# Patient Record
Sex: Male | Born: 1970 | Race: Black or African American | Hispanic: No | Marital: Married | Smoking: Never smoker
Health system: Southern US, Community
[De-identification: ages and names within clinical notes are randomized; demographics above are authoritative.]

## PROBLEM LIST (undated history)

## (undated) DIAGNOSIS — K56609 Unspecified intestinal obstruction, unspecified as to partial versus complete obstruction: Secondary | ICD-10-CM

## (undated) DIAGNOSIS — I509 Heart failure, unspecified: Secondary | ICD-10-CM

## (undated) DIAGNOSIS — I10 Essential (primary) hypertension: Secondary | ICD-10-CM

## (undated) DIAGNOSIS — R0600 Dyspnea, unspecified: Secondary | ICD-10-CM

## (undated) DIAGNOSIS — M1990 Unspecified osteoarthritis, unspecified site: Secondary | ICD-10-CM

## (undated) DIAGNOSIS — E119 Type 2 diabetes mellitus without complications: Secondary | ICD-10-CM

## (undated) DIAGNOSIS — M109 Gout, unspecified: Secondary | ICD-10-CM

## (undated) DIAGNOSIS — R0609 Other forms of dyspnea: Secondary | ICD-10-CM

## (undated) DIAGNOSIS — G4730 Sleep apnea, unspecified: Secondary | ICD-10-CM

## (undated) DIAGNOSIS — R079 Chest pain, unspecified: Secondary | ICD-10-CM

## (undated) HISTORY — DX: Heart failure, unspecified: I50.9

## (undated) HISTORY — DX: Unspecified intestinal obstruction, unspecified as to partial versus complete obstruction: K56.609

## (undated) HISTORY — PX: TOTAL HIP ARTHROPLASTY: SHX124

## (undated) HISTORY — PX: CARDIAC CATHETERIZATION: SHX172

---
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
## 1997-10-11 ENCOUNTER — Emergency Department (HOSPITAL_COMMUNITY): Admission: EM | Admit: 1997-10-11 | Discharge: 1997-10-11 | Payer: Self-pay

## 1998-02-27 ENCOUNTER — Emergency Department (HOSPITAL_COMMUNITY): Admission: EM | Admit: 1998-02-27 | Discharge: 1998-02-27 | Payer: Self-pay | Admitting: Emergency Medicine

## 2000-10-20 ENCOUNTER — Emergency Department (HOSPITAL_COMMUNITY): Admission: EM | Admit: 2000-10-20 | Discharge: 2000-10-21 | Payer: Self-pay

## 2000-10-20 ENCOUNTER — Encounter: Payer: Self-pay | Admitting: Emergency Medicine

## 2003-07-13 ENCOUNTER — Encounter: Admission: RE | Admit: 2003-07-13 | Discharge: 2003-07-13 | Payer: Self-pay | Admitting: Cardiovascular Disease

## 2003-09-23 ENCOUNTER — Emergency Department (HOSPITAL_COMMUNITY): Admission: EM | Admit: 2003-09-23 | Discharge: 2003-09-24 | Payer: Self-pay | Admitting: Emergency Medicine

## 2004-10-05 ENCOUNTER — Emergency Department (HOSPITAL_COMMUNITY): Admission: EM | Admit: 2004-10-05 | Discharge: 2004-10-05 | Payer: Self-pay | Admitting: Emergency Medicine

## 2005-06-06 ENCOUNTER — Encounter: Admission: RE | Admit: 2005-06-06 | Discharge: 2005-06-06 | Payer: Self-pay | Admitting: Cardiovascular Disease

## 2005-10-15 ENCOUNTER — Encounter (HOSPITAL_COMMUNITY): Admission: RE | Admit: 2005-10-15 | Discharge: 2005-12-12 | Payer: Self-pay | Admitting: Cardiovascular Disease

## 2007-09-08 ENCOUNTER — Ambulatory Visit (HOSPITAL_COMMUNITY): Admission: RE | Admit: 2007-09-08 | Discharge: 2007-09-09 | Payer: Self-pay | Admitting: Cardiovascular Disease

## 2008-01-22 ENCOUNTER — Emergency Department (HOSPITAL_COMMUNITY): Admission: EM | Admit: 2008-01-22 | Discharge: 2008-01-22 | Payer: Self-pay | Admitting: Emergency Medicine

## 2009-04-20 ENCOUNTER — Encounter: Admission: RE | Admit: 2009-04-20 | Discharge: 2009-04-20 | Payer: Self-pay | Admitting: Cardiovascular Disease

## 2012-03-17 ENCOUNTER — Other Ambulatory Visit: Payer: Self-pay | Admitting: Orthopedic Surgery

## 2012-04-07 ENCOUNTER — Inpatient Hospital Stay (HOSPITAL_COMMUNITY): Admission: RE | Admit: 2012-04-07 | Payer: Self-pay | Source: Ambulatory Visit

## 2012-04-13 ENCOUNTER — Inpatient Hospital Stay: Admit: 2012-04-13 | Payer: Self-pay | Admitting: Orthopedic Surgery

## 2012-04-13 SURGERY — ARTHROPLASTY, HIP, TOTAL,POSTERIOR APPROACH
Anesthesia: Choice | Laterality: Right

## 2012-08-14 ENCOUNTER — Encounter (HOSPITAL_COMMUNITY): Payer: Self-pay | Admitting: General Practice

## 2012-08-14 ENCOUNTER — Observation Stay (HOSPITAL_COMMUNITY)
Admission: AD | Admit: 2012-08-14 | Discharge: 2012-08-17 | Disposition: A | Discharge status: Home / Self Care | Payer: BC Managed Care – PPO | Source: Ambulatory Visit | Attending: Cardiovascular Disease | Admitting: Cardiovascular Disease

## 2012-08-14 DIAGNOSIS — R9439 Abnormal result of other cardiovascular function study: Secondary | ICD-10-CM | POA: Insufficient documentation

## 2012-08-14 DIAGNOSIS — E119 Type 2 diabetes mellitus without complications: Secondary | ICD-10-CM | POA: Insufficient documentation

## 2012-08-14 DIAGNOSIS — R079 Chest pain, unspecified: Secondary | ICD-10-CM | POA: Insufficient documentation

## 2012-08-14 DIAGNOSIS — E7800 Pure hypercholesterolemia, unspecified: Secondary | ICD-10-CM | POA: Insufficient documentation

## 2012-08-14 DIAGNOSIS — I2510 Atherosclerotic heart disease of native coronary artery without angina pectoris: Principal | ICD-10-CM | POA: Insufficient documentation

## 2012-08-14 DIAGNOSIS — I10 Essential (primary) hypertension: Secondary | ICD-10-CM | POA: Insufficient documentation

## 2012-08-14 DIAGNOSIS — I2589 Other forms of chronic ischemic heart disease: Secondary | ICD-10-CM | POA: Insufficient documentation

## 2012-08-14 HISTORY — DX: Chest pain, unspecified: R07.9

## 2012-08-14 HISTORY — DX: Type 2 diabetes mellitus without complications: E11.9

## 2012-08-14 HISTORY — DX: Essential (primary) hypertension: I10

## 2012-08-14 LAB — CBC WITH DIFFERENTIAL/PLATELET
Basophils Absolute: 0 10*3/uL (ref 0.0–0.1)
Basophils Relative: 1 % (ref 0–1)
HCT: 43 % (ref 39.0–52.0)
Hemoglobin: 14.6 g/dL (ref 13.0–17.0)
Lymphocytes Relative: 46 % (ref 12–46)
MCHC: 34 g/dL (ref 30.0–36.0)
Monocytes Relative: 10 % (ref 3–12)
Neutro Abs: 2.1 10*3/uL (ref 1.7–7.7)
Neutrophils Relative %: 41 % — ABNORMAL LOW (ref 43–77)
RDW: 15 % (ref 11.5–15.5)
WBC: 5 10*3/uL (ref 4.0–10.5)

## 2012-08-14 LAB — COMPREHENSIVE METABOLIC PANEL
ALT: 33 U/L (ref 0–53)
AST: 25 U/L (ref 0–37)
Albumin: 3.9 g/dL (ref 3.5–5.2)
Alkaline Phosphatase: 104 U/L (ref 39–117)
CO2: 25 mEq/L (ref 19–32)
Chloride: 105 mEq/L (ref 96–112)
GFR calc non Af Amer: >90 mL/min (ref >90)
Potassium: 3.9 mEq/L (ref 3.5–5.1)
Total Bilirubin: 0.3 mg/dL (ref 0.3–1.2)

## 2012-08-14 LAB — HEMOGLOBIN A1C: Mean Plasma Glucose: 200 mg/dL — ABNORMAL HIGH (ref <117)

## 2012-08-14 LAB — PROTIME-INR: INR: 1.01 (ref 0.00–1.49)

## 2012-08-14 LAB — TROPONIN I: Troponin I: <0.3 ng/mL (ref <0.30)

## 2012-08-14 LAB — HEPARIN LEVEL (UNFRACTIONATED): Heparin Unfractionated: 0.43 IU/mL (ref 0.30–0.70)

## 2012-08-14 LAB — APTT: aPTT: 28 seconds (ref 24–37)

## 2012-08-14 MED ORDER — OXYCODONE-ACETAMINOPHEN 5-325 MG PO TABS
1.0000 | ORAL_TABLET | ORAL | Status: DC | PRN
  Administered 2012-08-14 – 2012-08-17 (×7): 1 via ORAL
  Filled 2012-08-14 (×7): qty 1

## 2012-08-14 MED ORDER — INSULIN GLARGINE 100 UNIT/ML ~~LOC~~ SOLN
10.0000 [IU] | Freq: Every day | SUBCUTANEOUS | Status: DC
  Filled 2012-08-14 (×4): qty 0.1

## 2012-08-14 MED ORDER — ALPRAZOLAM 0.25 MG PO TABS: 0.2500 mg | ORAL_TABLET | Freq: Two times a day (BID) | ORAL | Status: DC | PRN

## 2012-08-14 MED ORDER — METOPROLOL TARTRATE 12.5 MG HALF TABLET
12.5000 mg | ORAL_TABLET | Freq: Two times a day (BID) | ORAL | Status: DC
  Administered 2012-08-14 – 2012-08-17 (×7): 12.5 mg via ORAL
  Filled 2012-08-14 (×8): qty 1

## 2012-08-14 MED ORDER — HEPARIN BOLUS VIA INFUSION
4000.0000 [IU] | Freq: Once | INTRAVENOUS | Status: AC
  Administered 2012-08-14: 4000 [IU] via INTRAVENOUS
  Filled 2012-08-14: qty 4000

## 2012-08-14 MED ORDER — ONDANSETRON HCL 4 MG/2ML IJ SOLN: 4.0000 mg | Freq: Four times a day (QID) | INTRAMUSCULAR | Status: DC | PRN

## 2012-08-14 MED ORDER — ASPIRIN 81 MG PO CHEW
324.0000 mg | CHEWABLE_TABLET | ORAL | Status: AC
  Administered 2012-08-14: 324 mg via ORAL
  Filled 2012-08-14: qty 4

## 2012-08-14 MED ORDER — INSULIN ASPART 100 UNIT/ML ~~LOC~~ SOLN: 4.0000 [IU] | Freq: Three times a day (TID) | SUBCUTANEOUS | Status: DC

## 2012-08-14 MED ORDER — LISINOPRIL 5 MG PO TABS
5.0000 mg | ORAL_TABLET | Freq: Every day | ORAL | Status: DC
  Administered 2012-08-14 – 2012-08-17 (×4): 5 mg via ORAL
  Filled 2012-08-14 (×4): qty 1

## 2012-08-14 MED ORDER — HEPARIN (PORCINE) IN NACL 100-0.45 UNIT/ML-% IJ SOLN
1750.0000 [IU]/h | INTRAMUSCULAR | Status: DC
  Administered 2012-08-14 – 2012-08-17 (×4): 1750 [IU]/h via INTRAVENOUS
  Filled 2012-08-14 (×6): qty 250

## 2012-08-14 MED ORDER — SIMVASTATIN 20 MG PO TABS
20.0000 mg | ORAL_TABLET | Freq: Every day | ORAL | Status: DC
  Administered 2012-08-14 – 2012-08-17 (×4): 20 mg via ORAL
  Filled 2012-08-14 (×4): qty 1

## 2012-08-14 MED ORDER — ASPIRIN 300 MG RE SUPP
300.0000 mg | RECTAL | Status: AC
  Filled 2012-08-14: qty 1

## 2012-08-14 MED ORDER — NITROGLYCERIN 0.4 MG SL SUBL
0.4000 mg | SUBLINGUAL_TABLET | SUBLINGUAL | Status: DC | PRN
  Administered 2012-08-14 – 2012-08-16 (×2): 0.4 mg via SUBLINGUAL
  Filled 2012-08-14: qty 25

## 2012-08-14 MED ORDER — GLIMEPIRIDE 2 MG PO TABS
2.0000 mg | ORAL_TABLET | Freq: Every day | ORAL | Status: DC
  Administered 2012-08-15 – 2012-08-17 (×3): 2 mg via ORAL
  Filled 2012-08-14 (×4): qty 1

## 2012-08-14 MED ORDER — AMLODIPINE BESYLATE 5 MG PO TABS
5.0000 mg | ORAL_TABLET | Freq: Every day | ORAL | Status: DC
  Administered 2012-08-14 – 2012-08-17 (×4): 5 mg via ORAL
  Filled 2012-08-14 (×4): qty 1

## 2012-08-14 MED ORDER — SODIUM CHLORIDE 0.9 % IJ SOLN: 3.0000 mL | INTRAMUSCULAR | Status: DC | PRN

## 2012-08-14 MED ORDER — ACETAMINOPHEN 325 MG PO TABS: 650.0000 mg | ORAL_TABLET | ORAL | Status: DC | PRN

## 2012-08-14 MED ORDER — METFORMIN HCL 500 MG PO TABS
500.0000 mg | ORAL_TABLET | Freq: Two times a day (BID) | ORAL | Status: DC
  Administered 2012-08-15 – 2012-08-16 (×4): 500 mg via ORAL
  Filled 2012-08-14 (×8): qty 1

## 2012-08-14 MED ORDER — SODIUM CHLORIDE 0.9 % IJ SOLN
3.0000 mL | Freq: Two times a day (BID) | INTRAMUSCULAR | Status: DC
  Administered 2012-08-14 – 2012-08-16 (×3): 3 mL via INTRAVENOUS

## 2012-08-14 MED ORDER — INSULIN ASPART 100 UNIT/ML ~~LOC~~ SOLN: 0.0000 [IU] | Freq: Three times a day (TID) | SUBCUTANEOUS | Status: DC

## 2012-08-14 MED ORDER — SODIUM CHLORIDE 0.9 % IV SOLN: 250.0000 mL | INTRAVENOUS | Status: DC | PRN

## 2012-08-14 MED ORDER — LINAGLIPTIN 5 MG PO TABS
5.0000 mg | ORAL_TABLET | Freq: Every day | ORAL | Status: DC
  Administered 2012-08-15 – 2012-08-17 (×3): 5 mg via ORAL
  Filled 2012-08-14 (×4): qty 1

## 2012-08-14 MED ORDER — ASPIRIN EC 81 MG PO TBEC
81.0000 mg | DELAYED_RELEASE_TABLET | Freq: Every day | ORAL | Status: DC
  Administered 2012-08-15 – 2012-08-16 (×2): 81 mg via ORAL
  Filled 2012-08-14 (×3): qty 1

## 2012-08-14 NOTE — Progress Notes (Signed)
Utilization review completed.  

## 2012-08-14 NOTE — Progress Notes (Signed)
ANTICOAGULATION CONSULT NOTE - Initial Consult  Pharmacy Consult for heparin Indication: chest pain/ACS  Allergies not on file  Patient Measurements:   Heparin Dosing Weight: 117  Vital Signs:    Labs: No results found for this basename: HGB, HCT, PLT, APTT, LABPROT, INR, HEPARINUNFRC, CREATININE, CKTOTAL, CKMB, TROPONINI,  in the last 72 hours  CrCl is unknown because no creatinine reading has been taken and the patient has no height on file.   Medical History: No past medical history on file.  Medications:  No prescriptions prior to admission   Scheduled:  . aspirin  324 mg Oral NOW   Or  . aspirin  300 mg Rectal NOW  . [START ON 08/15/2012] aspirin EC  81 mg Oral Daily  . insulin aspart  0-15 Units Subcutaneous TID WC  . insulin aspart  4 Units Subcutaneous TID WC  . insulin glargine  10 Units Subcutaneous QHS  . metoprolol tartrate  12.5 mg Oral BID  . simvastatin  20 mg Oral q1800  . sodium chloride  3 mL Intravenous Q12H    Assessment: 42 yo morbidly obese pt who was admitted for CP. IV heparin has been ordered to r/o MI.  Goal of Therapy:  Heparin level 0.3-0.7 units/ml Monitor platelets by anticoagulation protocol: Yes   Plan:   Heparin bolus 4000 units x1 Heparin drip at  1750 units/hr F/u with 6hr heparin level Daily level and CBC U[facility] [place] 08/14/2012,3:37 PM

## 2012-08-14 NOTE — H&P (Signed)
Jerry Hart is an 42 y.o. male.   Chief Complaint: Chest pain. HPI: 42 years old male with poorly controlled diabetes type II has 4 day history of sharp left sided chest pain along with left shoulder and arm pain. Patient feels this is different than his muscle pain. No fever or cough.  Past medical history: + DM, II, + hypertension, - smoking, - alcohol intake, + obesity, - exercise, + elevated cholesterol.  No past surgical history on file.  No family history on file. Social History:  has no tobacco, alcohol, and drug history on file.  Allergies: None  No prescriptions prior to admission    No results found for this or any previous visit (from the past 48 hour(s)). No results found.  @ROS @ Constitutional: Negative for activity change and appetite change.  HENT: Negative for neck stiffness.  Eyes: Negative for pain.  Respiratory: Negative for chest tightness and shortness of breath.  Cardiovascular: Positive for chest pain. and leg swelling.  Gastrointestinal: Negative for abdominal pain, nausea, vomiting and diarrhea.  Musculoskeletal: Positive for back pain. + Hip pain Skin: Negative for rash.  Neurological: Negative for numbness and headaches.  Psychiatric/Behavioral: Negative for anxiety.  Physical Exam Height 6\' 3"  (1.905 m), weight 141.976 kg (313 lb). Constitutional: He appears well developed and overnourished.  HENT: Head: Normocephalic and atraumatic. Eyes: Manson Passey, EOM are normal. Pupils are equal, round, and reactive to light. Tongue is pink and midline.  Neck: No JVD, Normal range of motion. Neck supple.  Cardiovascular: Normal rate, regular rhythm and normal heart sounds. II/VI systolic murmur.  Pulmonary/Chest: Effort normal and breath sounds normal. No respiratory distress. He has no wheezes. He has no rales.  Abdominal: Soft. Bowel sounds are increased. He exhibits distension. No epigastric tenderness. There is no rebound and no guarding.  Musculoskeletal:  Trace edema, no cyanosis, + clubbing.  Neurological: He is alert and oriented to person, place, and time. No cranial nerve deficit. Moves all 4 extremities. Skin: Skin is warm and dry.    Assessment/Plan Chest pain Hypertension Obesity DM, II  Place in observation R/O MI Nuclear stress test in AM.  [facility] S 08/14/2012, 3:47 PM

## 2012-08-15 ENCOUNTER — Observation Stay (HOSPITAL_COMMUNITY): Payer: BC Managed Care – PPO

## 2012-08-15 LAB — CBC
Hemoglobin: 13.6 g/dL (ref 13.0–17.0)
MCH: 25.7 pg — ABNORMAL LOW (ref 26.0–34.0)
MCHC: 33.3 g/dL (ref 30.0–36.0)
MCV: 77.3 fL — ABNORMAL LOW (ref 78.0–100.0)
RBC: 5.29 MIL/uL (ref 4.22–5.81)

## 2012-08-15 LAB — LIPID PANEL
Cholesterol: 136 mg/dL (ref 0–200)
HDL: 27 mg/dL — ABNORMAL LOW (ref >39)
LDL Cholesterol: 86 mg/dL (ref 0–99)
Total CHOL/HDL Ratio: 5 RATIO
Triglycerides: 116 mg/dL (ref <150)
VLDL: 23 mg/dL (ref 0–40)

## 2012-08-15 LAB — GLUCOSE, CAPILLARY: Glucose-Capillary: 160 mg/dL — ABNORMAL HIGH (ref 70–99)

## 2012-08-15 LAB — PROTIME-INR: INR: 1 (ref 0.00–1.49)

## 2012-08-15 LAB — BASIC METABOLIC PANEL
CO2: 22 mEq/L (ref 19–32)
Calcium: 9 mg/dL (ref 8.4–10.5)
Creatinine, Ser: 0.6 mg/dL (ref 0.50–1.35)
GFR calc non Af Amer: >90 mL/min (ref >90)
Glucose, Bld: 159 mg/dL — ABNORMAL HIGH (ref 70–99)
Sodium: 138 mEq/L (ref 135–145)

## 2012-08-15 MED ORDER — REGADENOSON 0.4 MG/5ML IV SOLN
0.4000 mg | Freq: Once | INTRAVENOUS | Status: AC
  Administered 2012-08-15: 0.4 mg via INTRAVENOUS
  Filled 2012-08-15: qty 5

## 2012-08-15 MED ORDER — TECHNETIUM TC 99M SESTAMIBI GENERIC - CARDIOLITE
30.0000 | Freq: Once | INTRAVENOUS | Status: AC | PRN
  Administered 2012-08-15: 30 via INTRAVENOUS

## 2012-08-15 MED ORDER — TECHNETIUM TC 99M SESTAMIBI GENERIC - CARDIOLITE
10.0000 | Freq: Once | INTRAVENOUS | Status: AC | PRN
  Administered 2012-08-15: 10 via INTRAVENOUS

## 2012-08-15 NOTE — Progress Notes (Signed)
ANTICOAGULATION CONSULT NOTE - Initial Consult  Pharmacy Consult for heparin Indication: chest pain/ACS  No Known Allergies  Patient Measurements: Height: 6\' 3"  (190.5 cm) Weight: 307 lb 6.4 oz (139.436 kg) IBW/kg (Calculated) : 84.5 Heparin Dosing Weight: 117  Vital Signs: Temp: 97.5 F (36.4 C) (05/17 0500) BP: 141/87 mmHg (05/17 0500) Pulse Rate: 72 (05/17 0500)  Labs:  Recent Labs  08/14/12 1548 08/14/12 2236 08/15/12 0517  HGB 14.6  --  13.6  HCT 43.0  --  40.9  PLT 225  --  212  APTT 28  --   --   LABPROT 13.2  --  13.1  INR 1.01  --  1.00  HEPARINUNFRC  --  0.43 0.39  CREATININE 0.68  --  0.60  TROPONINI <0.30 <0.30 <0.30    Estimated Creatinine Clearance: 184.9 ml/min (by C-G formula based on Cr of 0.6).   Medical History: Past Medical History  Diagnosis Date  . Chest pain 08/14/2012  . Hypertension   . Diabetes mellitus without complication     TYPE 2    Medications:  Prescriptions prior to admission  Medication Sig Dispense Refill  . amLODipine-olmesartan (AZOR) 5-40 MG per tablet Take 1 tablet by mouth daily.      Marland Kitchen glimepiride (AMARYL) 2 MG tablet Take 2 mg by mouth daily before breakfast.      . HYDROcodone-acetaminophen (NORCO/VICODIN) 5-325 MG per tablet Take 1 tablet by mouth daily.      Marland Kitchen linagliptin (TRADJENTA) 5 MG TABS tablet Take 5 mg by mouth daily.      . meloxicam (MOBIC) 15 MG tablet Take 15 mg by mouth daily.      . metFORMIN (GLUCOPHAGE) 500 MG tablet Take 500 mg by mouth daily with breakfast. Directions state 2 times per day       Scheduled:  . amLODipine  5 mg Oral Daily  . aspirin EC  81 mg Oral Daily  . glimepiride  2 mg Oral Q breakfast  . insulin aspart  0-15 Units Subcutaneous TID WC  . insulin aspart  4 Units Subcutaneous TID WC  . insulin glargine  10 Units Subcutaneous QHS  . linagliptin  5 mg Oral Daily  . lisinopril  5 mg Oral Daily  . metFORMIN  500 mg Oral BID WC  . metoprolol tartrate  12.5 mg Oral BID  .  simvastatin  20 mg Oral q1800  . sodium chloride  3 mL Intravenous Q12H    Assessment: 42 yo morbidly obese pt who was admitted for CP. IV heparin has been ordered to r/o MI. Level has been therapeutic x 2.  Goal of Therapy:  Heparin level 0.3-0.7 units/ml Monitor platelets by anticoagulation protocol: Yes   Plan:   Cont heparin drip 1750 units/hr

## 2012-08-15 NOTE — Progress Notes (Signed)
Pt c/o 5/10 CP, 1 SL nitro given, O2 initiated at 2L [place], EKG negative for changes, CP relieved, will continue to monitor.

## 2012-08-15 NOTE — Progress Notes (Signed)
ANTICOAGULATION CONSULT NOTE  Pharmacy Consult for heparin Indication: chest pain/ACS  No Known Allergies  Patient Measurements: Height: 6\' 3"  (190.5 cm) Weight: 313 lb (141.976 kg) IBW/kg (Calculated) : 84.5 Heparin Dosing Weight: 117  Vital Signs: Temp: 98 F (36.7 C) (05/16 2100) Temp src: Oral (05/16 1600) BP: 134/86 mmHg (05/16 2100) Pulse Rate: 74 (05/16 2100)  Labs:  Recent Labs  08/14/12 1548 08/14/12 2236  HGB 14.6  --   HCT 43.0  --   PLT 225  --   APTT 28  --   LABPROT 13.2  --   INR 1.01  --   HEPARINUNFRC  --  0.43  CREATININE 0.68  --   TROPONINI <0.30 <0.30    Estimated Creatinine Clearance: 186.6 ml/min (by C-G formula based on Cr of 0.68).   Assessment: 42 yo male with chest pain for heparin  Goal of Therapy:  Heparin level 0.3-0.7 units/ml Monitor platelets by anticoagulation protocol: Yes   Plan:  Continue Heparin at current rate  Follow-up am labs.  Osamah Schmader, Gary Fleet 08/15/2012,12:31 AM

## 2012-08-15 NOTE — Progress Notes (Signed)
Subjective:  Patient denies any further episodes of chest pain or shortness of breath  Objective:  Vital Signs in the last 24 hours: Temp:  [97.5 F (36.4 C)-98.2 F (36.8 C)] 97.5 F (36.4 C) (05/17 0500) Pulse Rate:  [70-87] 81 (05/17 0952) Resp:  [17-18] 18 (05/17 0500) BP: (114-141)/(74-94) 124/91 mmHg (05/17 0952) SpO2:  [97 %-98 %] 97 % (05/17 0500) Weight:  [139.436 kg (307 lb 6.4 oz)-141.976 kg (313 lb)] 139.436 kg (307 lb 6.4 oz) (05/17 0500)  Intake/Output from previous day:   Intake/Output from this shift:    Physical Exam: Neck: no adenopathy, no carotid bruit, no JVD and supple, symmetrical, trachea midline Lungs: clear to auscultation bilaterally Heart: regular rate and rhythm, S1, S2 normal, no murmur, click, rub or gallop Abdomen: soft, non-tender; bowel sounds normal; no masses,  no organomegaly Extremities: extremities normal, atraumatic, no cyanosis or edema  Lab Results:  Recent Labs  08/14/12 1548 08/15/12 0517  WBC 5.0 4.8  HGB 14.6 13.6  PLT 225 212    Recent Labs  08/14/12 1548 08/15/12 0517  NA 139 138  K 3.9 3.8  CL 105 105  CO2 25 22  GLUCOSE 128* 159*  BUN 12 12  CREATININE 0.68 0.60    Recent Labs  08/14/12 2236 08/15/12 0517  TROPONINI <0.30 <0.30   Hepatic Function Panel  Recent Labs  08/14/12 1548  PROT 7.7  ALBUMIN 3.9  AST 25  ALT 33  ALKPHOS 104  BILITOT 0.3    Recent Labs  08/15/12 0517  CHOL 136   No results found for this basename: PROTIME,  in the last 72 hours  Imaging: Imaging results have been reviewed and Dg Chest 2 View  08/15/2012   *RADIOLOGY REPORT*  Clinical Data: Chest pain  CHEST - 2 VIEW  Comparison: 04/20/2009  Findings: Mild right hemidiaphragm elevation. Midline trachea. Borderline cardiomegaly.     Mediastinal contours otherwise within normal limits.  No pleural effusion or pneumothorax.  Clear lungs.  IMPRESSION: Borderline cardiomegaly, without acute disease.   Original Report  Authenticated By: Jeronimo Greaves, M.D.    Cardiac Studies:  Assessment/Plan:  Status post chest pain MI ruled out Hypertension Diabetes matters Morbid obesity Hypercholesteremia Plan Schedule for nuclear stress test today if negative will DC home  LOS: 1 day    Jerry Hart N 08/15/2012, 10:12 AM

## 2012-08-16 LAB — GLUCOSE, CAPILLARY
Glucose-Capillary: 182 mg/dL — ABNORMAL HIGH (ref 70–99)
Glucose-Capillary: 104 mg/dL — ABNORMAL HIGH (ref 70–99)
Glucose-Capillary: 127 mg/dL — ABNORMAL HIGH (ref 70–99)

## 2012-08-16 LAB — CBC
HCT: 42.1 % (ref 39.0–52.0)
Hemoglobin: 14 g/dL (ref 13.0–17.0)
MCH: 25.8 pg — ABNORMAL LOW (ref 26.0–34.0)
MCHC: 33.3 g/dL (ref 30.0–36.0)
RDW: 15.2 % (ref 11.5–15.5)

## 2012-08-16 MED ORDER — DIAZEPAM 5 MG PO TABS: 5.0000 mg | ORAL_TABLET | ORAL | Status: AC

## 2012-08-16 MED ORDER — SODIUM CHLORIDE 0.9 % IJ SOLN: 3.0000 mL | INTRAMUSCULAR | Status: DC | PRN

## 2012-08-16 MED ORDER — SODIUM CHLORIDE 0.9 % IV SOLN
INTRAVENOUS | Status: DC
  Administered 2012-08-16 – 2012-08-17 (×2): via INTRAVENOUS

## 2012-08-16 MED ORDER — SODIUM CHLORIDE 0.9 % IV SOLN: 1.0000 mL/kg/h | INTRAVENOUS | Status: DC

## 2012-08-16 MED ORDER — SODIUM CHLORIDE 0.9 % IJ SOLN: 3.0000 mL | Freq: Two times a day (BID) | INTRAMUSCULAR | Status: DC

## 2012-08-16 MED ORDER — CLOPIDOGREL BISULFATE 75 MG PO TABS
300.0000 mg | ORAL_TABLET | Freq: Once | ORAL | Status: AC
  Administered 2012-08-17: 300 mg via ORAL
  Filled 2012-08-16: qty 4

## 2012-08-16 MED ORDER — DIAZEPAM 5 MG PO TABS
5.0000 mg | ORAL_TABLET | ORAL | Status: AC
  Administered 2012-08-17: 5 mg via ORAL
  Filled 2012-08-16: qty 1

## 2012-08-16 MED ORDER — SODIUM CHLORIDE 0.9 % IV SOLN: 250.0000 mL | INTRAVENOUS | Status: DC | PRN

## 2012-08-16 MED ORDER — ASPIRIN 81 MG PO CHEW
324.0000 mg | CHEWABLE_TABLET | ORAL | Status: AC
  Administered 2012-08-17: 324 mg via ORAL
  Filled 2012-08-16: qty 4

## 2012-08-16 NOTE — Progress Notes (Signed)
Subjective:  Patient denies any chest pain or shortness of breath. Discussed with patient regarding abnormal stress test and cardiac cath his risk and benefits and consents for the procedure  Objective:  Vital Signs in the last 24 hours: Temp:  [98 F (36.7 C)-98.2 F (36.8 C)] 98.2 F (36.8 C) (05/18 0639) Pulse Rate:  [71-80] 80 (05/18 0639) Resp:  [18] 18 (05/18 0639) BP: (119-126)/(75-86) 126/79 mmHg (05/18 0639) SpO2:  [98 %-99 %] 99 % (05/18 0639) Weight:  [140.116 kg (308 lb 14.4 oz)] 140.116 kg (308 lb 14.4 oz) (05/18 0639)  Intake/Output from previous day:   Intake/Output from this shift:    Physical Exam: Neck: no adenopathy, no carotid bruit, no JVD and supple, symmetrical, trachea midline Lungs: clear to auscultation bilaterally Heart: regular rate and rhythm, S1, S2 normal, no murmur, click, rub or gallop Abdomen: soft, non-tender; bowel sounds normal; no masses,  no organomegaly Extremities: extremities normal, atraumatic, no cyanosis or edema  Lab Results:  Recent Labs  08/15/12 0517 08/16/12 0435  WBC 4.8 6.2  HGB 13.6 14.0  PLT 212 237    Recent Labs  08/14/12 1548 08/15/12 0517  NA 139 138  K 3.9 3.8  CL 105 105  CO2 25 22  GLUCOSE 128* 159*  BUN 12 12  CREATININE 0.68 0.60    Recent Labs  08/14/12 2236 08/15/12 0517  TROPONINI <0.30 <0.30   Hepatic Function Panel  Recent Labs  08/14/12 1548  PROT 7.7  ALBUMIN 3.9  AST 25  ALT 33  ALKPHOS 104  BILITOT 0.3    Recent Labs  08/15/12 0517  CHOL 136   No results found for this basename: PROTIME,  in the last 72 hours  Imaging: Imaging results have been reviewed and Dg Chest 2 View  08/15/2012   *RADIOLOGY REPORT*  Clinical Data: Chest pain  CHEST - 2 VIEW  Comparison: 04/20/2009  Findings: Mild right hemidiaphragm elevation. Midline trachea. Borderline cardiomegaly.     Mediastinal contours otherwise within normal limits.  No pleural effusion or pneumothorax.  Clear lungs.   IMPRESSION: Borderline cardiomegaly, without acute disease.   Original Report Authenticated By: Jeronimo Greaves, M.D.   Nm Myocar Multi W/spect W/wall Motion / Ef  08/15/2012   *RADIOLOGY REPORT*  Clinical Data:  Chest pain.  Diabetes and hypertension.  MYOCARDIAL IMAGING WITH SPECT (REST AND PHARMACOLOGIC-STRESS) GATED LEFT VENTRICULAR WALL MOTION STUDY LEFT VENTRICULAR EJECTION FRACTION  Standard myocardial SPECT imaging was performed after resting intravenous injection of 10. Tc-36m tetrofosmin.  Subsequently, intravenous infusion of Lexiscan  was performed under the supervision of cardiology staff.  At peak effect of the drug, of Tc-21m tetrofosmin was injected intravenously and standard myocardial SPECT imaging was performed.  Quantitative gated imaging was also performed to evaluate left ventricular wall motion and estimated left ventricular ejection fraction.  Comparison:  Plain film 08/15/2012.  Prior exam of 09/08/2007.  Findings:  Rest images demonstrate mild left ventricular enlargement.  Apical to mid anteroseptal rest defect.  Stress images demonstrate area of inducible defect involving the apical to mid portion of the anteroseptal wall.  Evaluation of wall motion demonstrates global hypokinesis.  Ejection fraction is estimated at 43%.  End diastolic volume of 188 cc.  End systolic volume of  106 cc.  IMPRESSION:  1. Reversibility involving the apical to mid segment of the anteroseptal wall.  This is suspicious for inducible ischemia. This is superimposed upon a fixed defect, likely representing scar. 2.  Global hypokinesis. 3.  Ejection fraction estimated at 43%.  These results will be called to the ordering clinician or representative by the Radiologist Assistant, and communication documented in the PACS Dashboard.   Original Report Authenticated By: Jeronimo Greaves, M.D.    Cardiac Studies:  Assessment/Plan:  Status post chest pain MI ruled out /abnormal nuclear stress test as  above Hypertension  Diabetes matters  Morbid obesity  Hypercholesteremia Plan Discussed with patient at length regarding abnormal nuclear stress test and various options of treatment i.e. medical versus invasive left cath possible PTCA stenting its risk and benefits i.e. death MI stroke need for emergency CABG risk of restenosis local vascular complications etc. and consents for PCI  LOS: 2 days    Naima Veldhuizen N 08/16/2012, 12:23 PM

## 2012-08-16 NOTE — Progress Notes (Signed)
4/10 CP this morning at 0810, 1 SL nitro, 1 percocet, given, with relief, O2 @2L  [place], pain not associated with activity, pt was laying in bed, EKG WNL, will continue to monitor.

## 2012-08-16 NOTE — Progress Notes (Signed)
ANTICOAGULATION CONSULT NOTE - Initial Consult  Pharmacy Consult for heparin Indication: chest pain/ACS  No Known Allergies  Patient Measurements: Height: 6\' 3"  (190.5 cm) Weight: 308 lb 14.4 oz (140.116 kg) IBW/kg (Calculated) : 84.5 Heparin Dosing Weight: 117  Vital Signs: Temp: 98.2 F (36.8 C) (05/18 0639) Temp src: Oral (05/18 0639) BP: 126/79 mmHg (05/18 0639) Pulse Rate: 80 (05/18 0639)  Labs:  Recent Labs  08/14/12 1548  08/14/12 2236 08/15/12 0517 08/16/12 0435 08/16/12 0740  HGB 14.6  --   --  13.6 14.0  --   HCT 43.0  --   --  40.9 42.1  --   PLT 225  --   --  212 237  --   APTT 28  --   --   --   --   --   LABPROT 13.2  --   --  13.1  --   --   INR 1.01  --   --  1.00  --   --   HEPARINUNFRC  --   < > 0.43 0.39 >2.00* 0.49  CREATININE 0.68  --   --  0.60  --   --   TROPONINI <0.30  --  <0.30 <0.30  --   --   < > = values in this interval not displayed.  Estimated Creatinine Clearance: 185.2 ml/min (by C-G formula based on Cr of 0.6).   Medical History: Past Medical History  Diagnosis Date  . Chest pain 08/14/2012  . Hypertension   . Diabetes mellitus without complication     TYPE 2    Medications:  Prescriptions prior to admission  Medication Sig Dispense Refill  . amLODipine-olmesartan (AZOR) 5-40 MG per tablet Take 1 tablet by mouth daily.      Marland Kitchen glimepiride (AMARYL) 2 MG tablet Take 2 mg by mouth daily before breakfast.      . HYDROcodone-acetaminophen (NORCO/VICODIN) 5-325 MG per tablet Take 1 tablet by mouth daily.      Marland Kitchen linagliptin (TRADJENTA) 5 MG TABS tablet Take 5 mg by mouth daily.      . meloxicam (MOBIC) 15 MG tablet Take 15 mg by mouth daily.      . metFORMIN (GLUCOPHAGE) 500 MG tablet Take 500 mg by mouth daily with breakfast. Directions state 2 times per day       Scheduled:  . amLODipine  5 mg Oral Daily  . aspirin EC  81 mg Oral Daily  . glimepiride  2 mg Oral Q breakfast  . insulin aspart  0-15 Units Subcutaneous TID WC   . insulin aspart  4 Units Subcutaneous TID WC  . insulin glargine  10 Units Subcutaneous QHS  . linagliptin  5 mg Oral Daily  . lisinopril  5 mg Oral Daily  . metFORMIN  500 mg Oral BID WC  . metoprolol tartrate  12.5 mg Oral BID  . simvastatin  20 mg Oral q1800  . sodium chloride  3 mL Intravenous Q12H    Assessment: 42 yo morbidly obese pt who was admitted for CP. IV heparin has been ordered to r/o MI. First level was high this AM but was drawn incorrectly. Repeat level within therapeutic range.   Goal of Therapy:  Heparin level 0.3-0.7 units/ml Monitor platelets by anticoagulation protocol: Yes   Plan:   Cont heparin drip 1750 units/hr

## 2012-08-17 ENCOUNTER — Encounter (HOSPITAL_COMMUNITY)
Admission: AD | Disposition: A | Discharge status: Home / Self Care | Payer: Self-pay | Source: Ambulatory Visit | Attending: Cardiovascular Disease

## 2012-08-17 HISTORY — PX: LEFT HEART CATHETERIZATION WITH CORONARY ANGIOGRAM: SHX5451

## 2012-08-17 LAB — BASIC METABOLIC PANEL
BUN: 9 mg/dL (ref 6–23)
Chloride: 101 mEq/L (ref 96–112)
GFR calc Af Amer: >90 mL/min (ref >90)
GFR calc non Af Amer: >90 mL/min (ref >90)
Potassium: 3.8 mEq/L (ref 3.5–5.1)
Sodium: 135 mEq/L (ref 135–145)

## 2012-08-17 LAB — CBC
Hemoglobin: 13.9 g/dL (ref 13.0–17.0)
MCH: 25 pg — ABNORMAL LOW (ref 26.0–34.0)
Platelets: 213 10*3/uL (ref 150–400)
RBC: 5.55 MIL/uL (ref 4.22–5.81)
WBC: 5.7 10*3/uL (ref 4.0–10.5)

## 2012-08-17 LAB — HEPARIN LEVEL (UNFRACTIONATED): Heparin Unfractionated: 0.5 IU/mL (ref 0.30–0.70)

## 2012-08-17 LAB — GLUCOSE, CAPILLARY
Glucose-Capillary: 167 mg/dL — ABNORMAL HIGH (ref 70–99)
Glucose-Capillary: 191 mg/dL — ABNORMAL HIGH (ref 70–99)

## 2012-08-17 LAB — PROTIME-INR: Prothrombin Time: 13.1 seconds (ref 11.6–15.2)

## 2012-08-17 LAB — POCT ACTIVATED CLOTTING TIME: Activated Clotting Time: 127 seconds

## 2012-08-17 SURGERY — LEFT HEART CATHETERIZATION WITH CORONARY ANGIOGRAM
Anesthesia: LOCAL

## 2012-08-17 MED ORDER — LISINOPRIL 5 MG PO TABS: 5.0000 mg | ORAL_TABLET | Freq: Every day | ORAL | Status: DC

## 2012-08-17 MED ORDER — FENTANYL CITRATE 0.05 MG/ML IJ SOLN
INTRAMUSCULAR | Status: AC
  Filled 2012-08-17: qty 2

## 2012-08-17 MED ORDER — ACETAMINOPHEN 325 MG PO TABS: 650.0000 mg | ORAL_TABLET | ORAL | Status: DC | PRN

## 2012-08-17 MED ORDER — INSULIN GLARGINE 100 UNIT/ML ~~LOC~~ SOLN: 10.0000 [IU] | Freq: Every day | SUBCUTANEOUS | Status: DC

## 2012-08-17 MED ORDER — AMLODIPINE BESYLATE 5 MG PO TABS: 5.0000 mg | ORAL_TABLET | Freq: Every day | ORAL | Status: DC

## 2012-08-17 MED ORDER — HEPARIN (PORCINE) IN NACL 2-0.9 UNIT/ML-% IJ SOLN
INTRAMUSCULAR | Status: AC
  Filled 2012-08-17: qty 1000

## 2012-08-17 MED ORDER — MIDAZOLAM HCL 2 MG/2ML IJ SOLN
INTRAMUSCULAR | Status: AC
  Filled 2012-08-17: qty 2

## 2012-08-17 MED ORDER — LIDOCAINE HCL (PF) 1 % IJ SOLN
INTRAMUSCULAR | Status: AC
  Filled 2012-08-17: qty 30

## 2012-08-17 MED ORDER — "BD GETTING STARTED TAKE HOME KIT: 1ML X 30 G SYRINGES, "
1.0000 | Freq: Once | Status: AC
  Administered 2012-08-17: 1
  Filled 2012-08-17: qty 1

## 2012-08-17 MED ORDER — ASPIRIN 81 MG PO TBEC: 81.0000 mg | DELAYED_RELEASE_TABLET | Freq: Every day | ORAL | Status: DC

## 2012-08-17 MED ORDER — ROSUVASTATIN CALCIUM 5 MG PO TABS: 5.0000 mg | ORAL_TABLET | Freq: Every day | ORAL | Status: DC

## 2012-08-17 MED ORDER — SODIUM CHLORIDE 0.9 % IV SOLN
1.0000 mL/kg/h | INTRAVENOUS | Status: AC
  Administered 2012-08-17: 1 mL/kg/h via INTRAVENOUS

## 2012-08-17 MED ORDER — METOPROLOL TARTRATE 12.5 MG HALF TABLET: 12.5000 mg | ORAL_TABLET | Freq: Two times a day (BID) | ORAL | Status: DC

## 2012-08-17 MED ORDER — METFORMIN HCL 500 MG PO TABS: 500.0000 mg | ORAL_TABLET | Freq: Two times a day (BID) | ORAL | Status: DC

## 2012-08-17 MED ORDER — INSULIN PEN STARTER KIT
1.0000 | Freq: Once | Status: DC
  Filled 2012-08-17: qty 1

## 2012-08-17 MED ORDER — NITROGLYCERIN 0.4 MG SL SUBL: 0.4000 mg | SUBLINGUAL_TABLET | SUBLINGUAL | Status: DC | PRN

## 2012-08-17 NOTE — CV Procedure (Signed)
PROCEDURE:  Left heart catheterization with selective coronary angiography, left ventriculogram.  CLINICAL HISTORY:  This is a 42 years old male with recurrent chest pain and abnormal stress test + diabetes and hypertension.  The risks, benefits, and details of the procedure were explained to the patient.  The patient verbalized understanding and wanted to proceed.  Informed written consent was obtained.  PROCEDURE TECHNIQUE:  The patient was approached from the right femoral artery using a 5 French short sheath.  Left coronary angiography was done using a Judkins L4 guide catheter.  Right coronary angiography was done using a Judkins R4 guide catheter.  Left ventriculography was done using a pigtail catheter.    CONTRAST:  Total of 55 cc.  COMPLICATIONS:  None.  At the end of the procedure a manual device was used for hemostasis.    HEMODYNAMICS:  Aortic pressure was 130/84; LV pressure was 137/8; LVEDP 20.  There was no gradient between the left ventricle and aorta.    ANGIOGRAM/CORONARY ARTERIOGRAM:   The left main coronary artery is normal.  The left anterior descending artery has luminal irregularities. Narrow diagonal vessels with moderate to severe disease  The left circumflex artery has luminal irregularities. Large ramus and OM without significant disease.  The right coronary artery is dominant with mild diffuse disease. PDA and posterolateral also has mild diffuse disease. Severe narrowing of small marginal branch.  LEFT VENTRICULOGRAM:  Left ventricular angiogram was done in the 30 RAO projection and revealed anterior and inferior hypokinesia and moderate systolic dysfunction with an estimated ejection fraction of 40%.  LVEDP was 20 mmHg.  IMPRESSION OF HEART CATHETERIZATION:   1. Normal left main coronary artery. 2. Mild disease of left anterior descending artery and severe disease of small diagonal and septal perforator arteries. 3. Mild disease of left circumflex artery and  its branches. 4. Mild disease of right coronary artery and severe disease of small marginal branch. 5. Normal left ventricular systolic function.  LVEDP 20 mmHg.  Ejection fraction 40%.  RECOMMENDATION:   Life style modification and aspirin, lisinopril and Crestor use.

## 2012-08-17 NOTE — Progress Notes (Signed)
Subjective:  No chest pain today. + anxiety.  Objective:  Vital Signs in the last 24 hours: Temp:  [97.9 F (36.6 C)-98.3 F (36.8 C)] 97.9 F (36.6 C) (05/19 0500) Pulse Rate:  [69-81] 69 (05/19 0500) Cardiac Rhythm:  [-] Normal sinus rhythm;Heart block (05/19 0000) Resp:  [16-20] 18 (05/19 0500) BP: (121-146)/(73-90) 146/90 mmHg (05/19 0500) SpO2:  [99 %-100 %] 99 % (05/19 0500) Weight:  [138.211 kg (304 lb 11.2 oz)] 138.211 kg (304 lb 11.2 oz) (05/19 0500)  Physical Exam: BP Readings from Last 1 Encounters:  08/17/12 146/90     Wt Readings from Last 1 Encounters:  08/17/12 138.211 kg (304 lb 11.2 oz)    Weight change: -1.905 kg (-4 lb 3.2 oz)  HEENT: [place]/AT, Eyes-Brown, PERL, EOMI, Conjunctiva-Pink, Sclera-Non-icteric Neck: No JVD, No bruit, Trachea midline. Lungs:  Clear, Bilateral. Cardiac:  Regular rhythm, normal S1 and S2, no S3.  Abdomen:  Soft, non-tender. Extremities:  No edema present. No cyanosis. No clubbing. CNS: AxOx3, Cranial nerves grossly intact, moves all 4 extremities. Right handed. Skin: Warm and dry.   Intake/Output from previous day: 05/18 0701 - 05/19 0700 In: 1461.7 [P.O.:1080; I.V.:381.7] Out: -     Lab Results: BMET    Component Value Date/Time   NA 135 08/17/2012 0450   K 3.8 08/17/2012 0450   CL 101 08/17/2012 0450   CO2 28 08/17/2012 0450   GLUCOSE 173* 08/17/2012 0450   BUN 9 08/17/2012 0450   CREATININE 0.70 08/17/2012 0450   CALCIUM 9.1 08/17/2012 0450   GFRNONAA >90 08/17/2012 0450   GFRAA >90 08/17/2012 0450   CBC    Component Value Date/Time   WBC 5.7 08/17/2012 0450   RBC 5.55 08/17/2012 0450   HGB 13.9 08/17/2012 0450   HCT 43.5 08/17/2012 0450   PLT 213 08/17/2012 0450   MCV 78.4 08/17/2012 0450   MCH 25.0* 08/17/2012 0450   MCHC 32.0 08/17/2012 0450   RDW 15.2 08/17/2012 0450   LYMPHSABS 2.3 08/14/2012 1548   MONOABS 0.5 08/14/2012 1548   EOSABS 0.1 08/14/2012 1548   BASOSABS 0.0 08/14/2012 1548   CARDIAC ENZYMES Lab Results   Component Value Date   TROPONINI <0.30 08/15/2012    Scheduled Meds: . amLODipine  5 mg Oral Daily  . aspirin  324 mg Oral Pre-Cath  . aspirin EC  81 mg Oral Daily  . clopidogrel  300 mg Oral Once  . diazepam  5 mg Oral On Call  . glimepiride  2 mg Oral Q breakfast  . insulin aspart  0-15 Units Subcutaneous TID WC  . insulin aspart  4 Units Subcutaneous TID WC  . insulin glargine  10 Units Subcutaneous QHS  . linagliptin  5 mg Oral Daily  . lisinopril  5 mg Oral Daily  . metFORMIN  500 mg Oral BID WC  . metoprolol tartrate  12.5 mg Oral BID  . simvastatin  20 mg Oral q1800  . sodium chloride  3 mL Intravenous Q12H  . sodium chloride  3 mL Intravenous Q12H  . sodium chloride  3 mL Intravenous Q12H   Continuous Infusions: . sodium chloride 100 mL/hr at 08/17/12 0201  . sodium chloride 1 mL/kg/hr (08/17/12 0400)  . heparin 1,750 Units/hr (08/17/12 0201)   PRN Meds:.sodium chloride, sodium chloride, sodium chloride, acetaminophen, ALPRAZolam, nitroGLYCERIN, ondansetron (ZOFRAN) IV, oxyCODONE-acetaminophen, sodium chloride, sodium chloride, sodium chloride  Assessment/Plan: Chest pain  Hypertension  Obesity  DM, II  C. Cath today.  LOS: 3 days    Orpah Cobb  MD  08/17/2012, 7:03 AM

## 2012-08-17 NOTE — Progress Notes (Signed)
D/c orders received;IVs removed with gauze on, pt remains in stable condition, pt meds and instructions reviewed and given to pt; reviewed with pt how to draw up and administer insulin, pt able to demonstrate back to me; insulin starter kit given to pt; pt d/c to home

## 2012-08-17 NOTE — Discharge Summary (Signed)
Physician Discharge Summary  Patient ID: Jerry Hart MRN: [id number] DOB/AGE: 40/24/1972 40 y.o.  Admit date: 08/14/2012 Discharge date: 08/17/2012  Admission Diagnoses: Chest pain  Hypertension  Obesity  DM, II  Discharge Diagnoses:  Principle Problem: * Multivessel, native vessel coronary artery disease* Dilated and ischemic cardiomyopathy Chest pain  Hypertension  Obesity  DM, II   Discharged Condition: good  Hospital Course: 42 years old male with recurrent sharp left sided chest and shoulder pain had abnormal nuclear stress test. He underwent coronary angiography that showed mild major vessel disease and severe small vessel disease. His medications were adjusted and he was discharged home in stable condition with follow up by me in 1 week.  Consults: None  Significant Diagnostic Studies: labs: normal CBC and BMET. Elevated sugars.improving post insulin use.  Nuclear stress test:1. Reversibility involving the apical to mid segment of the anteroseptal wall. This is suspicious for inducible ischemia. This is superimposed upon a fixed defect, likely representing scar.  2. Global hypokinesis.  3. Ejection fraction estimated at 43%.  Treatments: cardiac meds: Aspirin, Metoprolol, lisinopril (Prinivil) and amlodipine and insulin: Lantus.  Discharge Exam: Blood pressure 106/54, pulse 97, temperature 98.4 F (36.9 C), temperature source Oral, resp. rate 17, height 6\' 3"  (1.905 m), weight 138.211 kg (304 lb 11.2 oz), SpO2 100.00%. Constitutional: He appears well developed and overnourished.  HENT: Head: Normocephalic and atraumatic. Eyes: Manson Passey, EOM are normal. Pupils are equal, round, and reactive to light. Tongue is pink and midline.  Neck: No JVD, Normal range of motion. Neck supple.  Cardiovascular: Normal rate, regular rhythm and normal heart sounds. II/VI systolic murmur.  Pulmonary/Chest: Effort normal and breath sounds normal. No respiratory distress. He has no  wheezes. He has no rales.  Abdominal: Soft. Bowel sounds are increased. He exhibits distension. No epigastric tenderness. There is no rebound and no guarding.  Musculoskeletal: Trace edema, no cyanosis, No clubbing.  Neurological: He is alert and oriented to person, place, and time. No cranial nerve deficit. Moves all 4 extremities. No right groin hematoma. Skin: Skin is warm and dry.    Disposition: 01-Home or self care.     Medication List    STOP taking these medications       AZOR 5-40 MG per tablet  Generic drug:  amLODipine-olmesartan     meloxicam 15 MG tablet  Commonly known as:  MOBIC     metFORMIN 500 MG tablet  Commonly known as:  GLUCOPHAGE      TAKE these medications       acetaminophen 325 MG tablet  Commonly known as:  TYLENOL  Take 2 tablets (650 mg total) by mouth every 4 (four) hours as needed.     amLODipine 5 MG tablet  Commonly known as:  NORVASC  Take 1 tablet (5 mg total) by mouth daily.     aspirin 81 MG EC tablet  Take 1 tablet (81 mg total) by mouth daily.     glimepiride 2 MG tablet  Commonly known as:  AMARYL  Take 2 mg by mouth daily before breakfast.     HYDROcodone-acetaminophen 5-325 MG per tablet  Commonly known as:  NORCO/VICODIN  Take 1 tablet by mouth daily.     insulin glargine 100 UNIT/ML injection  Commonly known as:  LANTUS  Inject 0.1 mLs (10 Units total) into the skin at bedtime.     linagliptin 5 MG Tabs tablet  Commonly known as:  TRADJENTA  Take 5 mg by mouth daily.  lisinopril 5 MG tablet  Commonly known as:  PRINIVIL,ZESTRIL  Take 1 tablet (5 mg total) by mouth daily.     metoprolol tartrate 12.5 mg Tabs  Commonly known as:  LOPRESSOR  Take 0.5 tablets (12.5 mg total) by mouth 2 (two) times daily.     nitroGLYCERIN 0.4 MG SL tablet  Commonly known as:  NITROSTAT  Place 1 tablet (0.4 mg total) under the tongue every 5 (five) minutes x 3 doses as needed for chest pain.     rosuvastatin 5 MG tablet   Commonly known as:  CRESTOR  Take 1 tablet (5 mg total) by mouth daily.           Follow-up Information   Follow up with [facility], MD. Schedule an appointment as soon as possible for a visit in 1 week.   Contact information:   [address] [place] [address] [phone]       Signed: Ricki Rodriguez 08/17/2012, 5:46 PM

## 2012-08-18 LAB — GLUCOSE, CAPILLARY: Glucose-Capillary: 153 mg/dL — ABNORMAL HIGH (ref 70–99)

## 2013-05-11 ENCOUNTER — Ambulatory Visit (INDEPENDENT_AMBULATORY_CARE_PROVIDER_SITE_OTHER): Payer: BC Managed Care – PPO | Admitting: Emergency Medicine

## 2013-05-11 ENCOUNTER — Ambulatory Visit: Payer: BC Managed Care – PPO

## 2013-05-11 VITALS — BP 126/90 | HR 80 | Temp 97.8°F | Resp 18 | Ht 74.5 in | Wt 316.0 lb

## 2013-05-11 DIAGNOSIS — M79646 Pain in unspecified finger(s): Secondary | ICD-10-CM

## 2013-05-11 DIAGNOSIS — M109 Gout, unspecified: Secondary | ICD-10-CM

## 2013-05-11 DIAGNOSIS — M79609 Pain in unspecified limb: Secondary | ICD-10-CM

## 2013-05-11 LAB — URIC ACID: URIC ACID, SERUM: 5.5 mg/dL (ref 4.0–7.8)

## 2013-05-11 MED ORDER — COLCHICINE 0.6 MG PO TABS: ORAL_TABLET | ORAL | Status: DC

## 2013-05-11 MED ORDER — INDOMETHACIN 50 MG PO CAPS: 50.0000 mg | ORAL_CAPSULE | Freq: Two times a day (BID) | ORAL | Status: DC

## 2013-05-11 NOTE — Progress Notes (Signed)
[facility] [address] [place] [address] [id number]  Date:  05/11/2013   Name:  Jerry Hart   DOB:  [birth year]   MRN:  [id number]  PCP:  Salena Saner., MD    Chief Complaint: finger swollen and painful   History of Present Illness:  Jerry Hart is a 43 y.o. very pleasant male patient who presents with the following:  No history of injury to his finger.  Has pain and swelling in the left third finger both in PIP and MCP joint since last week.  No fever or chills, redness, moderate swelling.  No improvement with over the counter medications or other home remedies. Denies other complaint or health concern today.   There are no active problems to display for this patient.   Past Medical History  Diagnosis Date  . Chest pain 08/14/2012  . Hypertension   . Diabetes mellitus without complication     TYPE 2    Past Surgical History  Procedure Laterality Date  . Hip pinning Right 1985    History  Substance Use Topics  . Smoking status: Never Smoker   . Smokeless tobacco: Never Used  . Alcohol Use: No    Family History  Problem Relation Age of Onset  . Cancer Mother   . Hypertension Mother     No Known Allergies  Medication list has been reviewed and updated.  Current Outpatient Prescriptions on File Prior to Visit  Medication Sig Dispense Refill  . aspirin EC 81 MG EC tablet Take 1 tablet (81 mg total) by mouth daily.      Marland Kitchen HYDROcodone-acetaminophen (NORCO/VICODIN) 5-325 MG per tablet Take 1 tablet by mouth daily.      . nitroGLYCERIN (NITROSTAT) 0.4 MG SL tablet Place 1 tablet (0.4 mg total) under the tongue every 5 (five) minutes x 3 doses as needed for chest pain.  25 tablet  1  . acetaminophen (TYLENOL) 325 MG tablet Take 2 tablets (650 mg total) by mouth every 4 (four) hours as needed.      Marland Kitchen amLODipine (NORVASC) 5 MG tablet Take 1 tablet (5 mg total) by mouth daily.  30 tablet  12  . glimepiride (AMARYL) 2 MG tablet  Take 2 mg by mouth daily before breakfast.      . insulin glargine (LANTUS) 100 UNIT/ML injection Inject 0.1 mLs (10 Units total) into the skin at bedtime.  10 mL  12  . linagliptin (TRADJENTA) 5 MG TABS tablet Take 5 mg by mouth daily.      Marland Kitchen lisinopril (PRINIVIL,ZESTRIL) 5 MG tablet Take 1 tablet (5 mg total) by mouth daily.  30 tablet  12  . metoprolol tartrate (LOPRESSOR) 12.5 mg TABS Take 0.5 tablets (12.5 mg total) by mouth 2 (two) times daily.  30 tablet  1  . rosuvastatin (CRESTOR) 5 MG tablet Take 1 tablet (5 mg total) by mouth daily.  30 tablet  6   No current facility-administered medications on file prior to visit.    Review of Systems:  As per HPI, otherwise negative.    Physical Examination: Filed Vitals:   05/11/13 1402  BP: 126/90  Pulse: 80  Temp: 97.8 F (36.6 C)  Resp: 18   Filed Vitals:   05/11/13 1402  Height: 6' 2.5" (1.892 m)  Weight: 316 lb (143.337 kg)   Body mass index is 40.04 kg/(m^2). Ideal Body Weight: Weight in (lb) to have BMI = 25: 196.9   GEN: WDWN,  NAD, Non-toxic, Alert & Oriented x 3 HEENT: Atraumatic, Normocephalic.  Ears and Nose: No external deformity. EXTR: No clubbing/cyanosis/edema NEURO: Normal gait.  PSYCH: Normally interactive. Conversant. Not depressed or anxious appearing.  Calm demeanor.  RIGHT hand:  Swollen and tender PIP and MCP joint 3rd finger.  Assessment and Plan: Gout Indocin Colchicine  Signed,  Ellison Carwin, MD   [facility] reading (PRIMARY) by  Dr. Ouida Sills. Negative finger.

## 2013-05-11 NOTE — Patient Instructions (Signed)

## 2014-02-04 ENCOUNTER — Other Ambulatory Visit: Payer: Self-pay | Admitting: Orthopedic Surgery

## 2014-03-10 ENCOUNTER — Encounter (HOSPITAL_COMMUNITY): Payer: Self-pay | Admitting: Cardiovascular Disease

## 2014-03-18 ENCOUNTER — Encounter (HOSPITAL_COMMUNITY)
Admission: RE | Admit: 2014-03-18 | Discharge: 2014-03-18 | Disposition: A | Discharge status: Home / Self Care | Payer: BC Managed Care – PPO | Source: Ambulatory Visit | Attending: Orthopedic Surgery | Admitting: Orthopedic Surgery

## 2014-03-18 ENCOUNTER — Encounter (HOSPITAL_COMMUNITY): Payer: Self-pay

## 2014-03-18 ENCOUNTER — Ambulatory Visit (HOSPITAL_COMMUNITY)
Admission: RE | Admit: 2014-03-18 | Discharge: 2014-03-18 | Disposition: A | Discharge status: Home / Self Care | Payer: BC Managed Care – PPO | Source: Ambulatory Visit | Attending: Orthopedic Surgery | Admitting: Orthopedic Surgery

## 2014-03-18 ENCOUNTER — Other Ambulatory Visit (HOSPITAL_COMMUNITY): Payer: Self-pay | Admitting: Cardiovascular Disease

## 2014-03-18 DIAGNOSIS — Z01818 Encounter for other preprocedural examination: Secondary | ICD-10-CM

## 2014-03-18 DIAGNOSIS — R079 Chest pain, unspecified: Secondary | ICD-10-CM | POA: Insufficient documentation

## 2014-03-18 DIAGNOSIS — I2510 Atherosclerotic heart disease of native coronary artery without angina pectoris: Secondary | ICD-10-CM

## 2014-03-18 HISTORY — DX: Gout, unspecified: M10.9

## 2014-03-18 HISTORY — DX: Other forms of dyspnea: R06.09

## 2014-03-18 HISTORY — DX: Dyspnea, unspecified: R06.00

## 2014-03-18 HISTORY — DX: Unspecified osteoarthritis, unspecified site: M19.90

## 2014-03-18 LAB — BASIC METABOLIC PANEL
Anion gap: 12 (ref 5–15)
BUN: 19 mg/dL (ref 6–23)
CALCIUM: 9.5 mg/dL (ref 8.4–10.5)
CO2: 25 mEq/L (ref 19–32)
Chloride: 98 mEq/L (ref 96–112)
Creatinine, Ser: 0.69 mg/dL (ref 0.50–1.35)
GFR calc Af Amer: >90 mL/min (ref >90)
GFR calc non Af Amer: >90 mL/min (ref >90)
Glucose, Bld: 255 mg/dL — ABNORMAL HIGH (ref 70–99)
Potassium: 4.5 mEq/L (ref 3.7–5.3)
SODIUM: 135 meq/L — AB (ref 137–147)

## 2014-03-18 LAB — CBC WITH DIFFERENTIAL/PLATELET
BASOS ABS: 0 10*3/uL (ref 0.0–0.1)
Basophils Relative: 0 % (ref 0–1)
Eosinophils Absolute: 0.1 10*3/uL (ref 0.0–0.7)
Eosinophils Relative: 2 % (ref 0–5)
HEMATOCRIT: 46.5 % (ref 39.0–52.0)
Hemoglobin: 15 g/dL (ref 13.0–17.0)
LYMPHS PCT: 48 % — AB (ref 12–46)
Lymphs Abs: 2.5 10*3/uL (ref 0.7–4.0)
MCH: 25.6 pg — ABNORMAL LOW (ref 26.0–34.0)
MCHC: 32.3 g/dL (ref 30.0–36.0)
MCV: 79.5 fL (ref 78.0–100.0)
MONO ABS: 0.5 10*3/uL (ref 0.1–1.0)
Monocytes Relative: 11 % (ref 3–12)
Neutro Abs: 2 10*3/uL (ref 1.7–7.7)
Neutrophils Relative %: 39 % — ABNORMAL LOW (ref 43–77)
PLATELETS: 232 10*3/uL (ref 150–400)
RBC: 5.85 MIL/uL — ABNORMAL HIGH (ref 4.22–5.81)
RDW: 14.9 % (ref 11.5–15.5)
WBC: 5.1 10*3/uL (ref 4.0–10.5)

## 2014-03-18 LAB — APTT: APTT: 28 s (ref 24–37)

## 2014-03-18 LAB — SURGICAL PCR SCREEN
MRSA, PCR: NEGATIVE
Staphylococcus aureus: POSITIVE — AB

## 2014-03-18 LAB — PROTIME-INR
INR: 0.98 (ref 0.00–1.49)
PROTHROMBIN TIME: 13.1 s (ref 11.6–15.2)

## 2014-03-18 NOTE — Progress Notes (Signed)
   03/18/14 1044  OBSTRUCTIVE SLEEP APNEA  Have you ever been diagnosed with sleep apnea through a sleep study? Yes (study was done, but whereabouts is unknown, but test came out "neg" per patient.)  If yes, do you have and use a CPAP or BPAP machine every night? 0  Do you snore loudly (loud enough to be heard through closed doors)?  0  Do you often feel tired, fatigued, or sleepy during the daytime? 0  Has anyone observed you stop breathing during your sleep? 1  Do you have, or are you being treated for high blood pressure? 1  BMI more than 35 kg/m2? 1  Age over 63 years old? 0  Neck circumference greater than 40 cm/16 inches? 0  Gender: 1  Obstructive Sleep Apnea Score 4  Score 4 or greater  Results sent to PCP

## 2014-03-18 NOTE — Progress Notes (Addendum)
Patient sees Dr. Doylene Canard.  I will call and see if he has had EKG done.Marland Kitchen  He's had a stress & heart cath in 2014, in went in with c/o chest pain, but patient states it was negative. 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 Elliot Cousin service, a sleep study was supposed to be ordered, but he's not heard nothing of it from them. Patient doesn't want to wear blue blood band through the Christmas holiday. He understands he will have to have sample drawn the surgery day...he agrees.   DA I just spoke with representative at Dr. Merrilee Jansky office.  Patient is to be over there for checkup and will get Ekg done there and they will fax me a copy.

## 2014-03-18 NOTE — Pre-Procedure Instructions (Signed)
TIERRA DIVELBISS  03/18/2014   Your procedure is scheduled on:  Monday, Dec. 28th   Report to [facility] Admitting at 5:30 AM.   Call this number if you have problems the morning of surgery: [phone]   Remember:   Do not eat food or drink liquids after midnight Sunday.   Take these medicines the morning of surgery with A SIP OF WATER: Norvasc, Hydrocodone, Metoprolol.                            DO NOT take any diabetes medication the morning of surgery.   Do not wear jewelry - no rings or watches.  Do not wear lotions - colognes.  You may NOT wear deodorant the morning of surgery.   Men may shave face and neck.   Do not bring valuables to the hospital.  [facility] is not responsible for any belongings or valuables.               Contacts, dentures or bridgework may not be worn into surgery.  Leave suitcase in the car. After surgery it may be brought to your room.  For patients admitted to the hospital, discharge time is determined by your treatment team.    Name and phone number of your driver:    Special Instructions: "Preparing for Surgery" instruction sheet.   Please read over the following fact sheets that you were given: Pain Booklet, Coughing and Deep Breathing, Blood Transfusion Information, MRSA Information and Surgical Site Infection Prevention

## 2014-03-19 LAB — URINE CULTURE
COLONY COUNT: NO GROWTH
Culture: NO GROWTH

## 2014-03-21 ENCOUNTER — Ambulatory Visit (HOSPITAL_COMMUNITY)
Admission: RE | Admit: 2014-03-21 | Discharge: 2014-03-21 | Disposition: A | Discharge status: Home / Self Care | Payer: BC Managed Care – PPO | Source: Ambulatory Visit | Attending: Cardiovascular Disease | Admitting: Cardiovascular Disease

## 2014-03-21 ENCOUNTER — Other Ambulatory Visit: Payer: Self-pay

## 2014-03-21 ENCOUNTER — Encounter (HOSPITAL_COMMUNITY)
Admission: RE | Admit: 2014-03-21 | Discharge: 2014-03-21 | Disposition: A | Discharge status: Home / Self Care | Payer: BC Managed Care – PPO | Source: Ambulatory Visit | Attending: Cardiovascular Disease | Admitting: Cardiovascular Disease

## 2014-03-21 VITALS — BP 131/91 | HR 81

## 2014-03-21 DIAGNOSIS — R079 Chest pain, unspecified: Secondary | ICD-10-CM | POA: Diagnosis not present

## 2014-03-21 DIAGNOSIS — I2510 Atherosclerotic heart disease of native coronary artery without angina pectoris: Secondary | ICD-10-CM

## 2014-03-21 DIAGNOSIS — I10 Essential (primary) hypertension: Secondary | ICD-10-CM | POA: Insufficient documentation

## 2014-03-21 DIAGNOSIS — E119 Type 2 diabetes mellitus without complications: Secondary | ICD-10-CM | POA: Insufficient documentation

## 2014-03-21 MED ORDER — REGADENOSON 0.4 MG/5ML IV SOLN
0.4000 mg | Freq: Once | INTRAVENOUS | Status: AC
Start: 2014-03-21 — End: 2014-03-21
  Administered 2014-03-21: 0.4 mg via INTRAVENOUS

## 2014-03-21 MED ORDER — REGADENOSON 0.4 MG/5ML IV SOLN
INTRAVENOUS | Status: AC
  Filled 2014-03-21: qty 5

## 2014-03-21 MED ORDER — TECHNETIUM TC 99M SESTAMIBI GENERIC - CARDIOLITE
30.0000 | Freq: Once | INTRAVENOUS | Status: AC | PRN
  Administered 2014-03-21: 30 via INTRAVENOUS

## 2014-03-21 MED ORDER — TECHNETIUM TC 99M SESTAMIBI GENERIC - CARDIOLITE
10.0000 | Freq: Once | INTRAVENOUS | Status: AC | PRN
  Administered 2014-03-21: 10 via INTRAVENOUS

## 2014-03-21 NOTE — H&P (Signed)
TOTAL HIP ADMISSION H&P  Patient is admitted for right total hip arthroplasty.  Subjective:  Chief Complaint: right hip pain  HPI: Jerry Hart, 43 y.o. male, has a history of pain and functional disability in the right hip(s) due to [facility] Femoral Epiphysis and patient has failed non-surgical conservative treatments for greater than 12 weeks to include NSAID's and/or analgesics, flexibility and strengthening excercises, supervised PT with diminished ADL's post treatment and activity modification.  Onset of symptoms was gradual starting 10 years ago with gradually worsening course since that time.The patient noted no past surgery on the right hip(s).  Patient currently rates pain in the right hip at 10 out of 10 with activity. Patient has night pain, worsening of pain with activity and weight bearing, pain that interfers with activities of daily living, pain with passive range of motion and crepitus. Patient has evidence of periarticular osteophytes and joint space narrowing by imaging studies. This condition presents safety issues increasing the risk of falls.   There is no current active infection.  There are no active problems to display for this patient.  Past Medical History  Diagnosis Date  . Chest pain 08/14/2012  . Hypertension   . Diabetes mellitus without complication     TYPE 2  . DOE (dyspnea on exertion)     whenever he is working hard  . Gout     left fingers  . Arthritis     in his hip    Past Surgical History  Procedure Laterality Date  . Hip pinning Right 1985  . Left heart catheterization with coronary angiogram N/A 08/17/2012    Procedure: LEFT HEART CATHETERIZATION WITH CORONARY ANGIOGRAM;  Surgeon: Birdie Riddle, MD;  Location: [place] CATH [facility];  Service: Cardiovascular;  Laterality: N/A;  . Cardiac catheterization      2014    No prescriptions prior to admission   No Known Allergies  History  Substance Use Topics  . Smoking status: Never Smoker   .  Smokeless tobacco: Never Used  . Alcohol Use: No    Family History  Problem Relation Age of Onset  . Cancer Mother   . Hypertension Mother      Review of Systems  Constitutional: Negative.   HENT: Negative.   Eyes:       Glasses  Respiratory: Negative.   Cardiovascular: Negative.   Gastrointestinal: Negative.   Genitourinary: Negative.   Musculoskeletal: Positive for joint pain.  Skin: Negative.   Neurological: Negative.   Endo/Heme/Allergies: Negative.   Psychiatric/Behavioral: Negative.     Objective:  Physical Exam  Constitutional: He is oriented to person, place, and time. He appears well-developed and well-nourished.  HENT:  Head: Normocephalic and atraumatic.  Eyes: Pupils are equal, round, and reactive to light.  Neck: Normal range of motion. Neck supple.  Cardiovascular: Intact distal pulses.   Respiratory: Effort normal.  Musculoskeletal: He exhibits tenderness.  Patient has essentially no internal rotation of his hip and there is pain when you attempt to get him past -5.  He has about a 15 flexion contracture foot tap is negative his skin is intact he is neurovascularly intact.   Neurological: He is alert and oriented to person, place, and time.  Skin: Skin is warm and dry.  Psychiatric: He has a normal mood and affect. His behavior is normal. Judgment and thought content normal.    Vital signs in last 24 hours:    Labs:   Estimated body mass index is 40.04 kg/(m^2)  as calculated from the following:   Height as of 05/11/13: 6' 2.5" (1.892 m).   Weight as of 05/11/13: 143.337 kg (316 lb).   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  Assessment/Plan:  End stage arthritis, right  hip(s)  The patient history, physical examination, clinical judgement of the provider and imaging studies are consistent with end stage degenerative joint disease of the right hip(s) and total hip arthroplasty is deemed medically necessary. The treatment options including medical management, injection therapy, arthroscopy and arthroplasty were discussed at length. The risks and benefits of total hip arthroplasty were presented and reviewed. The risks due to aseptic loosening, infection, stiffness, dislocation/subluxation,  thromboembolic complications and other imponderables were discussed.  The patient acknowledged the explanation, agreed to proceed with the plan and consent was signed. Patient is being admitted for inpatient treatment for surgery, pain control, PT, OT, prophylactic antibiotics, VTE prophylaxis, progressive ambulation and ADL's and discharge planning.The patient is planning to be discharged home with home health services

## 2014-03-24 NOTE — Progress Notes (Signed)
Pt returned call regarding time change and arrival time. Voices understanding of instructions to arrive a 0650.

## 2014-03-24 NOTE — Progress Notes (Signed)
Message left for pt. regarding time change,and for pt. To arrive @ 6:50 AM. Pt ask to call and let us know that she received message.

## 2014-03-27 MED ORDER — DEXTROSE-NACL 5-0.45 % IV SOLN: INTRAVENOUS | Status: DC

## 2014-03-27 MED ORDER — CEFAZOLIN SODIUM 10 G IJ SOLR
3.0000 g | INTRAMUSCULAR | Status: AC
  Administered 2014-03-28: 3 g via INTRAVENOUS
  Filled 2014-03-27: qty 3000

## 2014-03-28 ENCOUNTER — Inpatient Hospital Stay (HOSPITAL_COMMUNITY): Payer: BC Managed Care – PPO | Admitting: Certified Registered Nurse Anesthetist

## 2014-03-28 ENCOUNTER — Inpatient Hospital Stay (HOSPITAL_COMMUNITY): Payer: BC Managed Care – PPO | Admitting: Vascular Surgery

## 2014-03-28 ENCOUNTER — Encounter (HOSPITAL_COMMUNITY): Payer: Self-pay | Admitting: Certified Registered Nurse Anesthetist

## 2014-03-28 ENCOUNTER — Inpatient Hospital Stay (HOSPITAL_COMMUNITY)
Admission: RE | Admit: 2014-03-28 | Discharge: 2014-03-30 | DRG: 470 | Disposition: A | Discharge status: Home / Self Care | Payer: BC Managed Care – PPO | Source: Ambulatory Visit | Attending: Orthopedic Surgery | Admitting: Orthopedic Surgery

## 2014-03-28 ENCOUNTER — Encounter (HOSPITAL_COMMUNITY)
Admission: RE | Disposition: A | Discharge status: Home / Self Care | Payer: Self-pay | Source: Ambulatory Visit | Attending: Orthopedic Surgery

## 2014-03-28 ENCOUNTER — Inpatient Hospital Stay (HOSPITAL_COMMUNITY): Payer: BC Managed Care – PPO

## 2014-03-28 DIAGNOSIS — I440 Atrioventricular block, first degree: Secondary | ICD-10-CM | POA: Diagnosis present

## 2014-03-28 DIAGNOSIS — Z6841 Body Mass Index (BMI) 40.0 and over, adult: Secondary | ICD-10-CM | POA: Diagnosis not present

## 2014-03-28 DIAGNOSIS — M1611 Unilateral primary osteoarthritis, right hip: Principal | ICD-10-CM | POA: Diagnosis present

## 2014-03-28 DIAGNOSIS — I10 Essential (primary) hypertension: Secondary | ICD-10-CM | POA: Diagnosis present

## 2014-03-28 DIAGNOSIS — E1169 Type 2 diabetes mellitus with other specified complication: Secondary | ICD-10-CM | POA: Diagnosis present

## 2014-03-28 DIAGNOSIS — E119 Type 2 diabetes mellitus without complications: Secondary | ICD-10-CM | POA: Diagnosis present

## 2014-03-28 DIAGNOSIS — M109 Gout, unspecified: Secondary | ICD-10-CM | POA: Diagnosis present

## 2014-03-28 DIAGNOSIS — Z96641 Presence of right artificial hip joint: Secondary | ICD-10-CM | POA: Diagnosis present

## 2014-03-28 DIAGNOSIS — M1610 Unilateral primary osteoarthritis, unspecified hip: Secondary | ICD-10-CM | POA: Diagnosis present

## 2014-03-28 DIAGNOSIS — M25551 Pain in right hip: Secondary | ICD-10-CM | POA: Diagnosis present

## 2014-03-28 HISTORY — PX: TOTAL HIP ARTHROPLASTY: SHX124

## 2014-03-28 LAB — GLUCOSE, CAPILLARY
GLUCOSE-CAPILLARY: 230 mg/dL — AB (ref 70–99)
GLUCOSE-CAPILLARY: 272 mg/dL — AB (ref 70–99)
GLUCOSE-CAPILLARY: 296 mg/dL — AB (ref 70–99)
Glucose-Capillary: 265 mg/dL — ABNORMAL HIGH (ref 70–99)
Glucose-Capillary: 234 mg/dL — ABNORMAL HIGH (ref 70–99)

## 2014-03-28 LAB — TYPE AND SCREEN
ABO/RH(D): O POS
ANTIBODY SCREEN: NEGATIVE

## 2014-03-28 LAB — ABO/RH: ABO/RH(D): O POS

## 2014-03-28 SURGERY — ARTHROPLASTY, HIP, TOTAL,POSTERIOR APPROACH
Anesthesia: General | Laterality: Right

## 2014-03-28 MED ORDER — MELOXICAM 15 MG PO TABS
15.0000 mg | ORAL_TABLET | Freq: Every day | ORAL | Status: DC
  Administered 2014-03-28 – 2014-03-30 (×3): 15 mg via ORAL
  Filled 2014-03-28 (×3): qty 1

## 2014-03-28 MED ORDER — METHOCARBAMOL 500 MG PO TABS: 500.0000 mg | ORAL_TABLET | Freq: Two times a day (BID) | ORAL | Status: DC

## 2014-03-28 MED ORDER — AMLODIPINE BESYLATE 5 MG PO TABS
5.0000 mg | ORAL_TABLET | Freq: Every day | ORAL | Status: DC
  Administered 2014-03-29 – 2014-03-30 (×2): 5 mg via ORAL
  Filled 2014-03-28 (×2): qty 1

## 2014-03-28 MED ORDER — FENTANYL CITRATE 0.05 MG/ML IJ SOLN
INTRAMUSCULAR | Status: AC
  Filled 2014-03-28: qty 5

## 2014-03-28 MED ORDER — LINAGLIPTIN 5 MG PO TABS
5.0000 mg | ORAL_TABLET | Freq: Every day | ORAL | Status: DC
  Administered 2014-03-29 – 2014-03-30 (×2): 5 mg via ORAL
  Filled 2014-03-28 (×3): qty 1

## 2014-03-28 MED ORDER — SODIUM CHLORIDE 0.9 % IR SOLN: Status: DC | PRN

## 2014-03-28 MED ORDER — ACETAMINOPHEN 325 MG PO TABS: 650.0000 mg | ORAL_TABLET | Freq: Four times a day (QID) | ORAL | Status: DC | PRN

## 2014-03-28 MED ORDER — DIPHENHYDRAMINE HCL 12.5 MG/5ML PO ELIX: 12.5000 mg | ORAL_SOLUTION | ORAL | Status: DC | PRN

## 2014-03-28 MED ORDER — FENTANYL CITRATE 0.05 MG/ML IJ SOLN
INTRAMUSCULAR | Status: DC | PRN
  Administered 2014-03-28 (×2): 50 ug via INTRAVENOUS
  Administered 2014-03-28: 100 ug via INTRAVENOUS
  Administered 2014-03-28 (×2): 50 ug via INTRAVENOUS

## 2014-03-28 MED ORDER — METHOCARBAMOL 1000 MG/10ML IJ SOLN
500.0000 mg | Freq: Four times a day (QID) | INTRAVENOUS | Status: DC | PRN
  Filled 2014-03-28: qty 5

## 2014-03-28 MED ORDER — ONDANSETRON HCL 4 MG/2ML IJ SOLN
INTRAMUSCULAR | Status: DC | PRN
  Administered 2014-03-28: 4 mg via INTRAVENOUS

## 2014-03-28 MED ORDER — PROPOFOL 10 MG/ML IV BOLUS
INTRAVENOUS | Status: AC
  Filled 2014-03-28: qty 20

## 2014-03-28 MED ORDER — PROPOFOL 10 MG/ML IV BOLUS
INTRAVENOUS | Status: DC | PRN
  Administered 2014-03-28: 200 mg via INTRAVENOUS

## 2014-03-28 MED ORDER — OXYCODONE HCL 5 MG PO TABS
ORAL_TABLET | ORAL | Status: AC
  Administered 2014-03-28: 13:00:00
  Filled 2014-03-28: qty 2

## 2014-03-28 MED ORDER — ONDANSETRON HCL 4 MG PO TABS: 4.0000 mg | ORAL_TABLET | Freq: Four times a day (QID) | ORAL | Status: DC | PRN

## 2014-03-28 MED ORDER — METOCLOPRAMIDE HCL 5 MG/ML IJ SOLN: 5.0000 mg | Freq: Three times a day (TID) | INTRAMUSCULAR | Status: DC | PRN

## 2014-03-28 MED ORDER — GLIMEPIRIDE 4 MG PO TABS
4.0000 mg | ORAL_TABLET | Freq: Every day | ORAL | Status: DC
Start: 2014-03-29 — End: 2014-03-30
  Administered 2014-03-29 – 2014-03-30 (×2): 4 mg via ORAL
  Filled 2014-03-28 (×3): qty 1

## 2014-03-28 MED ORDER — METOCLOPRAMIDE HCL 10 MG PO TABS
5.0000 mg | ORAL_TABLET | Freq: Three times a day (TID) | ORAL | Status: DC | PRN
Start: 2014-03-28 — End: 2014-03-30

## 2014-03-28 MED ORDER — PHENOL 1.4 % MT LIQD: 1.0000 | OROMUCOSAL | Status: DC | PRN

## 2014-03-28 MED ORDER — SUCCINYLCHOLINE CHLORIDE 20 MG/ML IJ SOLN
INTRAMUSCULAR | Status: DC | PRN
  Administered 2014-03-28: 180 mg via INTRAVENOUS

## 2014-03-28 MED ORDER — METHOCARBAMOL 500 MG PO TABS
500.0000 mg | ORAL_TABLET | Freq: Four times a day (QID) | ORAL | Status: DC | PRN
  Administered 2014-03-28 – 2014-03-30 (×7): 500 mg via ORAL
  Filled 2014-03-28 (×7): qty 1

## 2014-03-28 MED ORDER — HYDROMORPHONE HCL 1 MG/ML IJ SOLN
INTRAMUSCULAR | Status: AC
  Administered 2014-03-28: 13:00:00
  Filled 2014-03-28: qty 1

## 2014-03-28 MED ORDER — ROSUVASTATIN CALCIUM 5 MG PO TABS: 5.0000 mg | ORAL_TABLET | Freq: Every day | ORAL | Status: DC

## 2014-03-28 MED ORDER — BISACODYL 5 MG PO TBEC: 5.0000 mg | DELAYED_RELEASE_TABLET | Freq: Every day | ORAL | Status: DC | PRN

## 2014-03-28 MED ORDER — KCL IN DEXTROSE-NACL 20-5-0.45 MEQ/L-%-% IV SOLN
INTRAVENOUS | Status: DC
  Administered 2014-03-28: 125 mL/h via INTRAVENOUS
  Administered 2014-03-28: 22:00:00 via INTRAVENOUS
  Filled 2014-03-28 (×9): qty 1000

## 2014-03-28 MED ORDER — INSULIN ASPART 100 UNIT/ML ~~LOC~~ SOLN
SUBCUTANEOUS | Status: DC | PRN
  Administered 2014-03-28: 4 [IU] via SUBCUTANEOUS
  Administered 2014-03-28: 6 [IU] via SUBCUTANEOUS

## 2014-03-28 MED ORDER — SENNOSIDES-DOCUSATE SODIUM 8.6-50 MG PO TABS
1.0000 | ORAL_TABLET | Freq: Every evening | ORAL | Status: DC | PRN
Start: 2014-03-28 — End: 2014-03-30

## 2014-03-28 MED ORDER — OXYCODONE-ACETAMINOPHEN 5-325 MG PO TABS: 1.0000 | ORAL_TABLET | ORAL | Status: DC | PRN

## 2014-03-28 MED ORDER — METOPROLOL TARTRATE 12.5 MG HALF TABLET: 12.5000 mg | ORAL_TABLET | Freq: Two times a day (BID) | ORAL | Status: DC

## 2014-03-28 MED ORDER — HYDROMORPHONE HCL 1 MG/ML IJ SOLN
1.0000 mg | INTRAMUSCULAR | Status: DC | PRN
  Administered 2014-03-28 – 2014-03-29 (×5): 1 mg via INTRAVENOUS
  Filled 2014-03-28 (×5): qty 1

## 2014-03-28 MED ORDER — HYDROMORPHONE HCL 1 MG/ML IJ SOLN
0.2500 mg | INTRAMUSCULAR | Status: DC | PRN
  Administered 2014-03-28: 0.5 mg via INTRAVENOUS
  Administered 2014-03-28 (×2): 0.25 mg via INTRAVENOUS
  Administered 2014-03-28 (×2): 0.5 mg via INTRAVENOUS

## 2014-03-28 MED ORDER — MIDAZOLAM HCL 2 MG/2ML IJ SOLN
INTRAMUSCULAR | Status: AC
  Filled 2014-03-28: qty 2

## 2014-03-28 MED ORDER — MIDAZOLAM HCL 5 MG/5ML IJ SOLN
INTRAMUSCULAR | Status: DC | PRN
  Administered 2014-03-28: 2 mg via INTRAVENOUS

## 2014-03-28 MED ORDER — OXYCODONE HCL 5 MG PO TABS
5.0000 mg | ORAL_TABLET | ORAL | Status: DC | PRN
  Administered 2014-03-28 – 2014-03-30 (×13): 10 mg via ORAL
  Filled 2014-03-28 (×12): qty 2

## 2014-03-28 MED ORDER — ACETAMINOPHEN 650 MG RE SUPP: 650.0000 mg | Freq: Four times a day (QID) | RECTAL | Status: DC | PRN

## 2014-03-28 MED ORDER — ALBUMIN HUMAN 5 % IV SOLN
INTRAVENOUS | Status: DC | PRN
  Administered 2014-03-28: 10:00:00 via INTRAVENOUS

## 2014-03-28 MED ORDER — HYDROMORPHONE HCL 1 MG/ML IJ SOLN
INTRAMUSCULAR | Status: AC
  Administered 2014-03-28: 12:00:00
  Filled 2014-03-28: qty 1

## 2014-03-28 MED ORDER — TRANEXAMIC ACID 100 MG/ML IV SOLN
2000.0000 mg | Freq: Once | INTRAVENOUS | Status: DC
  Filled 2014-03-28: qty 20

## 2014-03-28 MED ORDER — LIDOCAINE HCL (CARDIAC) 20 MG/ML IV SOLN
INTRAVENOUS | Status: DC | PRN
  Administered 2014-03-28: 80 mg via INTRAVENOUS

## 2014-03-28 MED ORDER — ASPIRIN EC 325 MG PO TBEC
325.0000 mg | DELAYED_RELEASE_TABLET | Freq: Every day | ORAL | Status: DC
  Administered 2014-03-29 – 2014-03-30 (×2): 325 mg via ORAL
  Filled 2014-03-28 (×3): qty 1

## 2014-03-28 MED ORDER — LACTATED RINGERS IV SOLN
INTRAVENOUS | Status: DC
  Administered 2014-03-28: 08:00:00 via INTRAVENOUS

## 2014-03-28 MED ORDER — TRANEXAMIC ACID 100 MG/ML IV SOLN
2000.0000 mg | INTRAVENOUS | Status: DC | PRN
  Administered 2014-03-28: 2000 mg via TOPICAL

## 2014-03-28 MED ORDER — PHENYLEPHRINE HCL 10 MG/ML IJ SOLN
10.0000 mg | INTRAVENOUS | Status: DC | PRN
  Administered 2014-03-28: 10 ug/min via INTRAVENOUS

## 2014-03-28 MED ORDER — BUPIVACAINE-EPINEPHRINE 0.5% -1:200000 IJ SOLN
INTRAMUSCULAR | Status: DC | PRN
  Administered 2014-03-28: 20 mL

## 2014-03-28 MED ORDER — METFORMIN HCL 500 MG PO TABS
1000.0000 mg | ORAL_TABLET | Freq: Two times a day (BID) | ORAL | Status: DC
  Filled 2014-03-28 (×4): qty 2

## 2014-03-28 MED ORDER — 0.9 % SODIUM CHLORIDE (POUR BTL) OPTIME
TOPICAL | Status: DC | PRN
  Administered 2014-03-28: 1000 mL

## 2014-03-28 MED ORDER — FLEET ENEMA 7-19 GM/118ML RE ENEM: 1.0000 | ENEMA | Freq: Once | RECTAL | Status: AC | PRN

## 2014-03-28 MED ORDER — ROCURONIUM BROMIDE 100 MG/10ML IV SOLN
INTRAVENOUS | Status: DC | PRN
Start: 2014-03-28 — End: 2014-03-28
  Administered 2014-03-28: 30 mg via INTRAVENOUS
  Administered 2014-03-28 (×3): 10 mg via INTRAVENOUS

## 2014-03-28 MED ORDER — NITROGLYCERIN 0.4 MG SL SUBL: 0.4000 mg | SUBLINGUAL_TABLET | SUBLINGUAL | Status: DC | PRN

## 2014-03-28 MED ORDER — PHENYLEPHRINE HCL 10 MG/ML IJ SOLN
INTRAMUSCULAR | Status: DC | PRN
  Administered 2014-03-28: 40 ug via INTRAVENOUS
  Administered 2014-03-28: 80 ug via INTRAVENOUS
  Administered 2014-03-28: 40 ug via INTRAVENOUS

## 2014-03-28 MED ORDER — ASPIRIN EC 325 MG PO TBEC: 325.0000 mg | DELAYED_RELEASE_TABLET | Freq: Two times a day (BID) | ORAL | Status: DC

## 2014-03-28 MED ORDER — AMLODIPINE-OLMESARTAN 5-40 MG PO TABS: 1.0000 | ORAL_TABLET | Freq: Every day | ORAL | Status: DC

## 2014-03-28 MED ORDER — SODIUM CHLORIDE 0.9 % IV SOLN
INTRAVENOUS | Status: DC | PRN
  Administered 2014-03-28 (×2): via INTRAVENOUS

## 2014-03-28 MED ORDER — MENTHOL 3 MG MT LOZG: 1.0000 | LOZENGE | OROMUCOSAL | Status: DC | PRN

## 2014-03-28 MED ORDER — METFORMIN HCL 500 MG PO TABS: 1000.0000 mg | ORAL_TABLET | Freq: Two times a day (BID) | ORAL | Status: DC

## 2014-03-28 MED ORDER — BUPIVACAINE-EPINEPHRINE (PF) 0.5% -1:200000 IJ SOLN
INTRAMUSCULAR | Status: AC
  Filled 2014-03-28: qty 30

## 2014-03-28 MED ORDER — GLYCOPYRROLATE 0.2 MG/ML IJ SOLN
INTRAMUSCULAR | Status: DC | PRN
  Administered 2014-03-28: .6 mg via INTRAVENOUS

## 2014-03-28 MED ORDER — INSULIN GLARGINE 100 UNIT/ML ~~LOC~~ SOLN: 10.0000 [IU] | Freq: Every day | SUBCUTANEOUS | Status: DC

## 2014-03-28 MED ORDER — INSULIN ASPART 100 UNIT/ML ~~LOC~~ SOLN
0.0000 [IU] | Freq: Three times a day (TID) | SUBCUTANEOUS | Status: DC
  Administered 2014-03-28: 8 [IU] via SUBCUTANEOUS
  Administered 2014-03-29 (×2): 11 [IU] via SUBCUTANEOUS
  Administered 2014-03-29: 8 [IU] via SUBCUTANEOUS
  Administered 2014-03-30: 2 [IU] via SUBCUTANEOUS
  Administered 2014-03-30: 3 [IU] via SUBCUTANEOUS

## 2014-03-28 MED ORDER — ALUMINUM HYDROXIDE GEL 320 MG/5ML PO SUSP
15.0000 mL | ORAL | Status: DC | PRN
  Filled 2014-03-28: qty 30

## 2014-03-28 MED ORDER — IRBESARTAN 300 MG PO TABS
300.0000 mg | ORAL_TABLET | Freq: Every day | ORAL | Status: DC
  Administered 2014-03-29 – 2014-03-30 (×2): 300 mg via ORAL
  Filled 2014-03-28 (×2): qty 1

## 2014-03-28 MED ORDER — DOCUSATE SODIUM 100 MG PO CAPS
100.0000 mg | ORAL_CAPSULE | Freq: Two times a day (BID) | ORAL | Status: DC
  Administered 2014-03-28 – 2014-03-30 (×4): 100 mg via ORAL
  Filled 2014-03-28 (×4): qty 1

## 2014-03-28 MED ORDER — NEOSTIGMINE METHYLSULFATE 10 MG/10ML IV SOLN
INTRAVENOUS | Status: DC | PRN
  Administered 2014-03-28: 4 mg via INTRAVENOUS

## 2014-03-28 MED ORDER — ONDANSETRON HCL 4 MG/2ML IJ SOLN: 4.0000 mg | Freq: Four times a day (QID) | INTRAMUSCULAR | Status: DC | PRN

## 2014-03-28 SURGICAL SUPPLY — 55 items
BLADE SAW SGTL 18X1.27X75 (BLADE) ×2 IMPLANT
BRUSH FEMORAL CANAL (MISCELLANEOUS) IMPLANT
CAPT HIP TOTAL 2 ×2 IMPLANT
COVER BACK TABLE 24X17X13 BIG (DRAPES) IMPLANT
COVER SURGICAL LIGHT HANDLE (MISCELLANEOUS) ×2 IMPLANT
DRAPE IMP U-DRAPE 54X76 (DRAPES) ×2 IMPLANT
DRAPE ORTHO SPLIT 77X108 STRL (DRAPES) ×1
DRAPE PROXIMA HALF (DRAPES) ×2 IMPLANT
DRAPE SURG ORHT 6 SPLT 77X108 (DRAPES) ×1 IMPLANT
DRAPE U-SHAPE 47X51 STRL (DRAPES) ×2 IMPLANT
DRILL BIT 7/64X5 (BIT) ×2 IMPLANT
DRSG AQUACEL AG ADV 3.5X10 (GAUZE/BANDAGES/DRESSINGS) ×2 IMPLANT
DRSG AQUACEL AG ADV 3.5X14 (GAUZE/BANDAGES/DRESSINGS) ×2 IMPLANT
DURAPREP 26ML APPLICATOR (WOUND CARE) ×2 IMPLANT
ELECT BLADE 4.0 EZ CLEAN MEGAD (MISCELLANEOUS)
ELECT REM PT RETURN 9FT ADLT (ELECTROSURGICAL) ×2
ELECTRODE BLDE 4.0 EZ CLN MEGD (MISCELLANEOUS) IMPLANT
ELECTRODE REM PT RTRN 9FT ADLT (ELECTROSURGICAL) ×1 IMPLANT
GAUZE XEROFORM 1X8 LF (GAUZE/BANDAGES/DRESSINGS) IMPLANT
GLOVE BIO SURGEON STRL SZ7.5 (GLOVE) ×2 IMPLANT
GLOVE BIO SURGEON STRL SZ8.5 (GLOVE) ×4 IMPLANT
GLOVE BIOGEL PI IND STRL 8 (GLOVE) ×2 IMPLANT
GLOVE BIOGEL PI IND STRL 9 (GLOVE) ×1 IMPLANT
GLOVE BIOGEL PI INDICATOR 8 (GLOVE) ×2
GLOVE BIOGEL PI INDICATOR 9 (GLOVE) ×1
GOWN STRL REUS W/ TWL LRG LVL3 (GOWN DISPOSABLE) ×1 IMPLANT
GOWN STRL REUS W/ TWL XL LVL3 (GOWN DISPOSABLE) ×2 IMPLANT
GOWN STRL REUS W/TWL LRG LVL3 (GOWN DISPOSABLE) ×1
GOWN STRL REUS W/TWL XL LVL3 (GOWN DISPOSABLE) ×2
HANDPIECE INTERPULSE COAX TIP (DISPOSABLE)
HOOD PEEL AWAY FACE SHEILD DIS (HOOD) ×4 IMPLANT
KIT BASIN OR (CUSTOM PROCEDURE TRAY) ×2 IMPLANT
KIT ROOM TURNOVER OR (KITS) ×2 IMPLANT
MANIFOLD NEPTUNE II (INSTRUMENTS) ×2 IMPLANT
NEEDLE 22X1 1/2 (OR ONLY) (NEEDLE) ×2 IMPLANT
NS IRRIG 1000ML POUR BTL (IV SOLUTION) ×2 IMPLANT
PACK TOTAL JOINT (CUSTOM PROCEDURE TRAY) ×2 IMPLANT
PACK UNIVERSAL I (CUSTOM PROCEDURE TRAY) ×2 IMPLANT
PAD ARMBOARD 7.5X6 YLW CONV (MISCELLANEOUS) ×6 IMPLANT
PASSER SUT SWANSON 36MM LOOP (INSTRUMENTS) ×2 IMPLANT
PRESSURIZER FEMORAL UNIV (MISCELLANEOUS) IMPLANT
SET HNDPC FAN SPRY TIP SCT (DISPOSABLE) IMPLANT
SUT ETHIBOND 2 V 37 (SUTURE) ×2 IMPLANT
SUT VIC AB 0 CTB1 27 (SUTURE) ×2 IMPLANT
SUT VIC AB 1 CTX 36 (SUTURE) ×1
SUT VIC AB 1 CTX36XBRD ANBCTR (SUTURE) ×1 IMPLANT
SUT VIC AB 2-0 CTB1 (SUTURE) ×4 IMPLANT
SUT VIC AB 3-0 SH 27 (SUTURE) ×1
SUT VIC AB 3-0 SH 27X BRD (SUTURE) ×1 IMPLANT
SYR CONTROL 10ML LL (SYRINGE) ×2 IMPLANT
TOWEL OR 17X24 6PK STRL BLUE (TOWEL DISPOSABLE) ×2 IMPLANT
TOWEL OR 17X26 10 PK STRL BLUE (TOWEL DISPOSABLE) ×2 IMPLANT
TOWER CARTRIDGE SMART MIX (DISPOSABLE) IMPLANT
TRAY FOLEY CATH 14FR (SET/KITS/TRAYS/PACK) IMPLANT
WATER STERILE IRR 1000ML POUR (IV SOLUTION) IMPLANT

## 2014-03-28 NOTE — Anesthesia Procedure Notes (Signed)
Procedure Name: Intubation Date/Time: 03/28/2014 9:38 AM Performed by: Garner Nash Pre-anesthesia Checklist: Patient identified, Timeout performed, Emergency Drugs available, Suction available and Patient being monitored Patient Re-evaluated:Patient Re-evaluated prior to inductionOxygen Delivery Method: Circle system utilized Preoxygenation: Pre-oxygenation with 100% oxygen Intubation Type: IV induction Grade View: Grade II Airway Equipment and Method: Video-laryngoscopy Placement Confirmation: ETT inserted through vocal cords under direct vision,  breath sounds checked- equal and bilateral,  positive ETCO2 and CO2 detector Secured at: 22 cm Tube secured with: Tape Dental Injury: Teeth and Oropharynx as per pre-operative assessment  Difficulty Due To: Difficulty was anticipated Comments: Large neck circumference, limited mouth opening

## 2014-03-28 NOTE — Anesthesia Postprocedure Evaluation (Signed)
Anesthesia Post Note  Patient: Jerry Hart  Procedure(s) Performed: Procedure(s) (LRB): RIGHT TOTAL HIP ARTHROPLASTY (Right)  Anesthesia type: General  Patient location: PACU  Post pain: Pain level controlled and Adequate analgesia  Post assessment: Post-op Vital signs reviewed, Patient's Cardiovascular Status Stable, Respiratory Function Stable, Patent Airway and Pain level controlled  Last Vitals:  Filed Vitals:   03/28/14 1215  BP: 105/66  Pulse:   Temp:   Resp: 19    Post vital signs: Reviewed and stable  Level of consciousness: awake, alert  and oriented  Complications: No apparent anesthesia complications

## 2014-03-28 NOTE — Anesthesia Preprocedure Evaluation (Addendum)
Anesthesia Evaluation  Patient identified by MRN, date of birth, ID band Patient awake    Airway Mallampati: III   Neck ROM: Full    Dental  (+) Dental Advisory Given, Teeth Intact, Chipped   Pulmonary neg pulmonary ROS,  breath sounds clear to auscultation        Cardiovascular hypertension, On Medications + DOE Rhythm:Regular Rate:Normal  NSR- 1st degree AV block   Neuro/Psych negative neurological ROS     GI/Hepatic   Endo/Other  diabetes, Type 2, Oral Hypoglycemic AgentsMorbid obesityCBG 265 (03/28/14 @ 0750)  Renal/GU      Musculoskeletal  (+) Arthritis -,   Abdominal   Peds  Hematology   Anesthesia Other Findings   Reproductive/Obstetrics                           Anesthesia Physical Anesthesia Plan  ASA: III  Anesthesia Plan: General   Post-op Pain Management:    Induction: Intravenous  Airway Management Planned: Oral ETT  Additional Equipment:   Intra-op Plan:   Post-operative Plan: Extubation in OR  Informed Consent: I have reviewed the patients History and Physical, chart, labs and discussed the procedure including the risks, benefits and alternatives for the proposed anesthesia with the patient or authorized representative who has indicated his/her understanding and acceptance.   Dental advisory given  Plan Discussed with: CRNA  Anesthesia Plan Comments:         Anesthesia Quick Evaluation

## 2014-03-28 NOTE — Transfer of Care (Signed)
Immediate Anesthesia Transfer of Care Note  Patient: Jerry Hart  Procedure(s) Performed: Procedure(s): RIGHT TOTAL HIP ARTHROPLASTY (Right)  Patient Location: PACU  Anesthesia Type:General  Level of Consciousness: awake, alert  and oriented  Airway & Oxygen Therapy: Patient Spontanous Breathing and Patient connected to nasal cannula oxygen  Post-op Assessment: Report given to PACU RN and Post -op Vital signs reviewed and stable  Post vital signs: Reviewed and stable  Complications: No apparent anesthesia complications

## 2014-03-28 NOTE — Op Note (Signed)
OPERATIVE REPORT    DATE OF PROCEDURE:  03/28/2014       PREOPERATIVE DIAGNOSIS:  RIGHT HIP DEGENERATIVE JOINT DISEASE                                                          POSTOPERATIVE DIAGNOSIS:  RIGHT HIP DEGENERATIVE JOINT DISEASE                                                           PROCEDURE:  R total hip arthroplasty using a 58 mm DePuy Pinnacle  Cup, [employer], 10-degree polyethylene liner index superior  and posterior, a +0 36 mm ceramic head, a [id number] SROM stem, 22FL Sleeve   SURGEON: Terrilyn Tyner J    ASSISTANT:   Eric K. Barton Dubois  (present throughout entire procedure and necessary for timely completion of the procedure)   ANESTHESIA: General BLOOD LOSS: 500 FLUID REPLACEMENT: 1800 crystalloid COMPLICATIONS: none    INDICATIONS FOR PROCEDURE: A 43 y.o. year-old With  [place]   for 30+ years, old SCFE, x-rays show bone-on-bone arthritic changes. Despite conservative measures with observation, anti-inflammatory medicine, narcotics, use of a cane, has severe unremitting pain and can ambulate only a few blocks before resting.  Patient desires elective R total hip arthroplasty to decrease pain and increase function. The risks, benefits, and alternatives were discussed at length including but not limited to the risks of infection, bleeding, nerve injury, stiffness, blood clots, the need for revision surgery, cardiopulmonary complications, among others, and they were willing to proceed. Questions answered     PROCEDURE IN DETAIL: The patient was identified by armband,  received preoperative IV antibiotics in the holding area at [facility], taken to the operating room , appropriate anesthetic monitors  were attached and general endotracheal anesthesia induced. Foley catheter was inserted. Pt was rolled into the L lateral decubitus position and fixed there with a Stulberg Mark II pelvic clamp.  The R lower extremity  was then prepped and draped  in the usual sterile fashion from the ankle to the hemipelvis. A time-out  procedure was performed. The skin along the lateral hip and thigh  infiltrated with 10 mL of 0.5% Marcaine and epinephrine solution. We  then made a posterolateral approach to the hip. With a #10 blade, a 18 cm  incision was made through the skin and subcutaneous tissue down to the level of the  IT band. Small bleeders were identified and cauterized. The IT band was cut in  line with skin incision exposing the greater trochanter. A Cobra retractor was placed between the gluteus minimus and the superior hip joint capsule, and a spiked Cobra between the quadratus femoris and the inferior hip joint capsule. This isolated the short  external rotators and piriformis tendons. These were tagged with a #2 Ethibond  suture and cut off their insertion on the intertrochanteric crest. The posterior  capsule was then developed into an acetabular-based flap from Posterior Superior off of the acetabulum out over the femoral neck and back posterior inferior to the acetabular rim. This flap was tagged with two #  2 Ethibond sutures and retracted protecting the sciatic nerve. This exposed the arthritic femoral head and osteophytes. The hip was then flexed and internally rotated, dislocating the femoral head and a standard neck cut performed 1 fingerbreadth above the lesser trochanter.  A spiked Cobra was placed in the cotyloid notch and a Hohmann retractor was then used to lever the femur anteriorly off of the anterior pelvic column. A posterior-inferior wing retractor was placed at the junction of the acetabulum and the ischium completing the acetabular exposure.We then removed the peripheral osteophytes and labrum from the acetabulum. We then reamed the acetabulum up to 57 mm with basket reamers obtaining good coverage in all quadrants. We then irrigated with normal  saline solution and hammered into place a 58 mm  pinnacle cup in 45  degrees of abduction and about 20 degrees of anteversion. More  peripheral osteophytes removed and a trial 10-degree liner placed with the  index superior-posterior. The hip was then flexed and internally rotated exposing the  proximal femur, which was entered with the initiating reamer followed by  the axial reamers up to a 17.5 mm full depth and 39mm partial depth. We then conically reamed to 31F to the correct depth for a 42 base neck. The calcar was milled to 31FL. A trial cone and stem was inserted in the 25 degrees anteversion, with a +0 37mm trial head. Trial reduction was then performed and excellent stability was noted with at 90 of flexion with 75 of internal rotation and then full extension with maximal external rotation. The hip could not be dislocated in full extension. The knee could easily flex  to about 130 degrees. We also stretched the abductors at this point,  because of the preexisting adductor contractures. All trial components  were then removed. The acetabulum was irrigated out with normal saline  solution. A titanium [facility]pex [facility] was then screwed into place  followed by a 10-degree polyethylene liner index superior-posterior. On  the femoral side a 31FL ZTT1 sleeve was hammered into place, followed by a [id number] SROM stem in 25 degrees of anteversion. At this point, a +0 36 mm ceramic head was  hammered on the stem. The hip was reduced. We checked our stability  one more time and found it to be excellent. The wound was once again  thoroughly irrigated out with normal saline solution pulse lavage. The  capsular flap and short external rotators were repaired back to the  intertrochanteric crest through drill holes with a #2 Ethibond suture.  The IT band was closed with running 1 Vicryl suture. The subcutaneous  tissue with 0 and 2-0 undyed Vicryl suture and the skin with running  interlocking 3-0 nylon suture. Dressing of Xeroform and Mepilex  was  then applied. The patient was then unclamped, rolled supine, awaken extubated and taken to recovery room without difficulty in stable condition.   Jahmiya Guidotti J 03/28/2014, 10:40 AM

## 2014-03-28 NOTE — Evaluation (Signed)
Physical Therapy Evaluation Patient Details Name: Jerry Hart MRN: [id number] DOB: 1970/04/08 Today's Date: 03/28/2014   History of Present Illness  43 y.o. male admitted to [facility] on 03/28/14 for elective R posterior THA.  Pt with significant PMHx of HTN, DM, DOE, gout, R hip pinning 1985, and cardiac cath 2014.   Clinical Impression  Pt is POD #0 s/p R THA posterior.  He is mobilizing well min to min guard assist overall for short distance gait.  Hip education initiated and handouts provided.  Pt would benefit from continued therapy services acutely and should progress well enough to return home with wife's assist and HHPT f/u at discharge.     Follow Up Recommendations Home health PT;Supervision for mobility/OOB    Equipment Recommendations  Rolling walker with 5" wheels    Recommendations for Other Services   NA    Precautions / Restrictions Precautions Precautions: Posterior Hip Precaution Booklet Issued: Yes (comment) Precaution Comments: precautions handout and exercise handout given and precautions reviewed as well as WBAT status Restrictions Weight Bearing Restrictions: Yes RLE Weight Bearing: Weight bearing as tolerated      Mobility  Bed Mobility Overal bed mobility: Needs Assistance Bed Mobility: Supine to Sit     Supine to sit: Min assist     General bed mobility comments: Min assist to help progress right leg to EOB.  Verbal cues for 1/2 bridge technique and hand placement.   Transfers Overall transfer level: Needs assistance Equipment used: Rolling walker (2 wheeled) Transfers: Sit to/from Stand Sit to Stand: Min guard         General transfer comment: Min guard assist with RW.  Verbal cues for safe hand placement.   Ambulation/Gait Ambulation/Gait assistance: Min guard Ambulation Distance (Feet): 8 Feet Assistive device: Rolling walker (2 wheeled) Gait Pattern/deviations: Step-to pattern Gait velocity: decreased Gait velocity interpretation:  Below normal speed for age/gender General Gait Details: Verbal cues for correct LE sequencing during gait.  Antalgic gait pattern.               Balance Overall balance assessment: Needs assistance Sitting-balance support: Feet supported;No upper extremity supported Sitting balance-Leahy Scale: Good     Standing balance support: Bilateral upper extremity supported Standing balance-Leahy Scale: Poor                               Pertinent Vitals/Pain Pain Assessment: 0-10 Pain Score: 8  Pain Location: right hip Pain Descriptors / Indicators: Aching;Burning Pain Intervention(s): Limited activity within patient's tolerance;Monitored during session;Repositioned    Home Living Family/patient expects to be discharged to:: Private residence Living Arrangements: Spouse/significant other;Children Available Help at Discharge: Family;Available 24 hours/day (x 1 week off of work) Type of Home: House Home Access: Stairs to enter Entrance Stairs-Rails: Right;Left;Can reach both Technical brewer of Steps: 4 Home Layout: One level Home Equipment: Cane - single point      Prior Function Level of Independence: Independent with assistive device(s)         Comments: used cane PTA        Extremity/Trunk Assessment   Upper Extremity Assessment: Defer to OT evaluation           Lower Extremity Assessment: RLE deficits/detail RLE Deficits / Details: right leg with normal post-op pain and weakness.  Pt with 3/5 ankle, 3-/5 knee, and 2/5 hip    Cervical / Trunk Assessment: Normal  Communication   Communication: No difficulties  Cognition  Arousal/Alertness: Lethargic;Suspect due to medications Behavior During Therapy: [facility] for tasks assessed/performed Overall Cognitive Status: Within Functional Limits for tasks assessed                         Exercises Total Joint Exercises Ankle Circles/Pumps: AROM;Both;10 reps;Supine      Assessment/Plan     PT Assessment Patient needs continued PT services  PT Diagnosis Difficulty walking;Abnormality of gait;Generalized weakness;Acute pain   PT Problem List Decreased strength;Decreased range of motion;Decreased activity tolerance;Decreased balance;Decreased mobility;Decreased knowledge of use of DME;Decreased knowledge of precautions;Pain  PT Treatment Interventions DME instruction;Gait training;Stair training;Functional mobility training;Therapeutic activities;Therapeutic exercise;Balance training;Neuromuscular re-education;Patient/family education;Modalities;Manual techniques   PT Goals (Current goals can be found in the Care Plan section) Acute Rehab PT Goals Patient Stated Goal: to go home, decrease pain PT Goal Formulation: With patient/family Time For Goal Achievement: 04/04/14 Potential to Achieve Goals: Good    Frequency 7X/week           End of Session   Activity Tolerance: Patient limited by fatigue;Patient limited by pain Patient left: in chair;with call bell/phone within reach;with family/visitor present Nurse Communication: Mobility status         Time: 5790-3833 PT Time Calculation (min) (ACUTE ONLY): 25 min   Charges:   PT Evaluation $Initial PT Evaluation Tier I: 1 Procedure PT Treatments $Therapeutic Activity: 8-22 mins        Sanad Fearnow B. Tameeka Luo, PT, DPT [phone]   03/28/2014, 5:36 PM

## 2014-03-29 ENCOUNTER — Encounter (HOSPITAL_COMMUNITY): Payer: Self-pay | Admitting: Orthopedic Surgery

## 2014-03-29 LAB — BASIC METABOLIC PANEL
ANION GAP: 8 (ref 5–15)
BUN: 17 mg/dL (ref 6–23)
CO2: 23 mmol/L (ref 19–32)
Calcium: 8.5 mg/dL (ref 8.4–10.5)
Chloride: 96 mEq/L (ref 96–112)
Creatinine, Ser: 1.82 mg/dL — ABNORMAL HIGH (ref 0.50–1.35)
GFR calc Af Amer: 52 mL/min — ABNORMAL LOW (ref >90)
GFR, EST NON AFRICAN AMERICAN: 45 mL/min — AB (ref >90)
Glucose, Bld: 363 mg/dL — ABNORMAL HIGH (ref 70–99)
POTASSIUM: 4.5 mmol/L (ref 3.5–5.1)
SODIUM: 127 mmol/L — AB (ref 135–145)

## 2014-03-29 LAB — CBC
HCT: 36.8 % — ABNORMAL LOW (ref 39.0–52.0)
Hemoglobin: 11.6 g/dL — ABNORMAL LOW (ref 13.0–17.0)
MCH: 24.6 pg — AB (ref 26.0–34.0)
MCHC: 31.5 g/dL (ref 30.0–36.0)
MCV: 78 fL (ref 78.0–100.0)
PLATELETS: 234 10*3/uL (ref 150–400)
RBC: 4.72 MIL/uL (ref 4.22–5.81)
RDW: 15.4 % (ref 11.5–15.5)
WBC: 10.1 10*3/uL (ref 4.0–10.5)

## 2014-03-29 LAB — GLUCOSE, CAPILLARY
Glucose-Capillary: 335 mg/dL — ABNORMAL HIGH (ref 70–99)
Glucose-Capillary: 304 mg/dL — ABNORMAL HIGH (ref 70–99)
Glucose-Capillary: 271 mg/dL — ABNORMAL HIGH (ref 70–99)
Glucose-Capillary: 184 mg/dL — ABNORMAL HIGH (ref 70–99)

## 2014-03-29 LAB — HEMOGLOBIN A1C
Hgb A1c MFr Bld: 10.9 % — ABNORMAL HIGH (ref <5.7)
Mean Plasma Glucose: 266 mg/dL — ABNORMAL HIGH (ref <117)

## 2014-03-29 NOTE — Progress Notes (Signed)
CARE MANAGEMENT NOTE 03/29/2014  Patient:  MAKS, CAVALLERO   Account Number:  [id number]  Date Initiated:  03/29/2014  Documentation initiated by:  [facility]  Subjective/Objective Assessment:   s/p rt total hip arthroplasty     Action/Plan:   PT/OT evals-recommended HHPT   Anticipated DC Date:  03/30/2014   Anticipated DC Plan:  [place]  CM consult      [facility] Choice  [place]   Choice offered to / List presented to:     DME arranged  [place]  3-N-1      DME agency  [employer]     HH arranged  HH-2 PT      [place].   Status of service:  Completed, signed off Medicare Important Message given?   (If response is "NO", the following Medicare IM given date fields will be blank) Date Medicare IM given:   Medicare IM given by:   Date Additional Medicare IM given:   Additional Medicare IM given by:    Discharge Disposition:  [place]  Per UR Regulation:  Reviewed for med. necessity/level of care/duration of stay  If discussed at Long Length of Stay Meetings, dates discussed:    Comments:  03/30/14 Set  up with [facility] by MD office for HHPT. [employer] providing rolling walker and 3N1. Fuller Plan RN,BSN, CCM

## 2014-03-29 NOTE — Progress Notes (Signed)
Inpatient Diabetes Program Recommendations  AACE/ADA: New Consensus Statement on Inpatient Glycemic Control (2013)  Target Ranges:  Prepandial:   less than 140 mg/dL      Peak postprandial:   less than 180 mg/dL (1-2 hours)      Critically ill patients:  140 - 180 mg/dL   Reason for Visit: Hyperglycemia  Diabetes history: DM2 Outpatient Diabetes medications: tradjenta 5 mg QD, amaryl 4 mg QD, metformin 1000 mg bid Current orders for Inpatient glycemic control: Novolog moderate tidwc, tradjenta 5 mg QD, amaryl 4 mg QD  Results for Jerry Hart, Jerry Hart (MRN [id number]) as of 03/29/2014 15:15  Ref. Range 03/28/2014 11:19 03/28/2014 14:21 03/28/2014 21:36 03/29/2014 06:34 03/29/2014 11:28  Glucose-Capillary Latest Range: 70-99 mg/dL 230 (H) 272 (H) 296 (H) 335 (H) 304 (H)  Results for JAYDIS, DUCHENE (MRN [id number]) as of 03/29/2014 15:15  Ref. Range 03/28/2014 16:00  Hgb A1c MFr Bld Latest Range: <5.7 % 10.9 (H)    Inpatient Diabetes Program Recommendations Insulin - Basal: Consider addition of Lantus 15 units QHS Correction (SSI): Increase Novolog to resistant tidwc and hs HgbA1C: 10.9% - uncontrolled   Note: Will need f/u with PCP for diabetes medication adjustments to improve glycemic control.  Will continue to follow. Thank you. Lorenda Peck, RD, LDN, CDE Inpatient Diabetes Coordinator [phone]

## 2014-03-29 NOTE — Progress Notes (Signed)
PHARMACIST - PHYSICIAN COMMUNICATION DR:  Mayer Camel CONCERNING:  METFORMIN SAFE ADMINISTRATION POLICY  RECOMMENDATION: Metformin has been placed on DISCONTINUE (rejected order) STATUS and should be reordered only after any of the conditions below are ruled out.  Current safety recommendations include avoiding metformin for a minimum of 48 hours after the patient's exposure to intravenous contrast media.  DESCRIPTION:  The Pharmacy Committee has adopted a policy that restricts the use of metformin in hospitalized patients until all the contraindications to administration have been ruled out. Specific contraindications are: []  Serum creatinine ? 1.5 for males [x]  Serum creatinine ? 1.4 for females []  Shock, acute MI, sepsis, hypoxemia, dehydration []  Planned administration of intravenous iodinated contrast media []  Heart Failure patients with low EF [] Acute or chronic metabolic acidosis (including DKA)   Jerry Hart, PharmD, [place] Clinical Staff Pharmacist Pager [phone]

## 2014-03-29 NOTE — Progress Notes (Signed)
Physical Therapy Treatment Patient Details Name: Jerry Hart MRN: [id number] DOB: 02-14-1971 Today's Date: 03/29/2014    History of Present Illness 43 y.o. male admitted to [facility] on 03/28/14 for elective R posterior THA.  Pt with significant PMHx of HTN, DM, DOE, gout, R hip pinning 1985, and cardiac cath 2014.     PT Comments    Pt was able to preform stairs with minimal assist, one crutch and one railing.  He did not feel confident enough in his right leg to do just both railings (not enough support).  I reinforced hip precautions and gait.  We will need to practice bed mobility, stairs, and standing exercises before he discharges.  PT will continue to follow acutely.   Follow Up Recommendations  Home health PT;Supervision for mobility/OOB     Equipment Recommendations  Rolling walker with 5" wheels (and axillary crutches)    Recommendations for Other Services   NA     Precautions / Restrictions Precautions Precautions: Posterior Hip Precaution Booklet Issued: Yes (comment) Precaution Comments: Pt unable to report any hip precautions, verbally reviewed and showed him the handout.  Restrictions Weight Bearing Restrictions: Yes RLE Weight Bearing: Weight bearing as tolerated    Mobility  Bed Mobility               General bed mobility comments: pt up in chair  Transfers Overall transfer level: Needs assistance Equipment used: Rolling walker (2 wheeled) Transfers: Sit to/from Stand Sit to Stand: Supervision         General transfer comment: supervision for safety, pt demonstrating safe technique.   Ambulation/Gait Ambulation/Gait assistance: Supervision Ambulation Distance (Feet): 100 Feet Assistive device: Rolling walker (2 wheeled) Gait Pattern/deviations: Step-to pattern;Antalgic Gait velocity: decreased Gait velocity interpretation: Below normal speed for age/gender General Gait Details: Pt continues to have moderately antalgic gait pattern, but  compensates well with upper extremity support. Chair to follow after doing stairs to encourage increased gait distance.  Pt reports less lightheaded this PM, but has been up all day in the recliner chair, which helps.    Stairs Stairs: Yes Stairs assistance: Min assist Stair Management: One rail Right;One rail Left;Step to pattern;Forwards;With crutches (one rail and one crutch. ) Number of Stairs: 2 General stair comments: Pt needs min assist to stabilize for balance while going up and down the stairs.  Verbal cues for correct LE sequencing.  Pt attempted using bil rails, but did not feel confident enough in this technique.  Much better with one crutch and one rail.          Balance Overall balance assessment: Needs assistance Sitting-balance support: Feet supported;No upper extremity supported Sitting balance-Leahy Scale: Good     Standing balance support: Bilateral upper extremity supported;Single extremity supported;No upper extremity supported Standing balance-Leahy Scale: Fair                      Cognition Arousal/Alertness: Awake/alert Behavior During Therapy: Flat affect Overall Cognitive Status: Within Functional Limits for tasks assessed                      Exercises Total Joint Exercises Ankle Circles/Pumps: AROM;Both;10 reps;Supine Quad Sets: AROM;Right;10 reps;Supine Short Arc Quad: AROM;Right;10 reps;Supine Heel Slides: AAROM;Right;10 reps;Supine Hip ABduction/ADduction: AAROM;Right;10 reps;Supine Long Arc Quad: AROM;Right;10 reps;Supine        Pertinent Vitals/Pain Pain Assessment: 0-10 Pain Score: 8  Pain Location: right hip Pain Descriptors / Indicators: Aching;Burning Pain Intervention(s): Limited activity within patient's  tolerance;Monitored during session;Repositioned    Home Living Family/patient expects to be discharged to:: Private residence Living Arrangements: Spouse/significant other;Children Available Help at Discharge:  Family;Available 24 hours/day (for 1 week) Type of Home: House Home Access: Stairs to enter Entrance Stairs-Rails: Right;Left;Can reach both Home Layout: One level Home Equipment: Cane - single point      Prior Function Level of Independence: Independent with assistive device(s)      Comments: used cane PTA   PT Goals (current goals can now be found in the care plan section) Acute Rehab PT Goals Patient Stated Goal: to go home, decrease pain Progress towards PT goals: Progressing toward goals    Frequency  7X/week    PT Plan Current plan remains appropriate       End of Session   Activity Tolerance: Patient limited by fatigue;Patient limited by pain Patient left: in chair;with call bell/phone within reach;with family/visitor present     Time: 1441-1520 PT Time Calculation (min) (ACUTE ONLY): 39 min  Charges:  $Gait Training: 23-37 mins $Therapeutic Exercise: 8-22 mins                      Audrina Marten B. Cassell Voorhies, PT, DPT [phone]   03/29/2014, 3:30 PM

## 2014-03-29 NOTE — Progress Notes (Signed)
Orthopedic Tech Progress Note Patient Details:  Jerry Hart 08-15-1970 [id number]  Ortho Devices Type of Ortho Device: Crutches Ortho Device/Splint Interventions: Ordered, Adjustment   Braulio Bosch 03/29/2014, 3:44 PM

## 2014-03-29 NOTE — Evaluation (Signed)
Occupational Therapy Evaluation Patient Details Name: Jerry Hart MRN: [id number] DOB: April 26, 1970 Today's Date: 03/29/2014    History of Present Illness 43 y.o. male admitted to [facility] on 03/28/14 for elective R posterior THA.  Pt with significant PMHx of HTN, DM, DOE, gout, R hip pinning 1985, and cardiac cath 2014.    Clinical Impression   Pt was independent in self care and ambulated with a cane PTA.  Presents with lethargy likely due to medication, R hip pain, and dependence in LB ADL and ADL transfers. Educated in hip precautions related to ADL, chair height at home. Will need further practice with AE, standing activities, and tub transfer. Will follow.    Follow Up Recommendations  Supervision/Assistance - 24 hour;Home health OT    Equipment Recommendations  3 in 1 bedside comode    Recommendations for Other Services       Precautions / Restrictions Precautions Precautions: Posterior Hip;Fall Precaution Booklet Issued: Yes (comment) Precaution Comments: instructed in hip precautions related to ADL Restrictions Weight Bearing Restrictions: Yes RLE Weight Bearing: Weight bearing as tolerated      Mobility Bed Mobility               General bed mobility comments: pt up in chair  Transfers Overall transfer level: Needs assistance Equipment used: Rolling walker (2 wheeled) Transfers: Sit to/from Stand Sit to Stand: Min guard         General transfer comment: assist for safety and walker stabilization, verbal cues for hand placement    Balance Overall balance assessment: Needs assistance Sitting-balance support: No upper extremity supported;Feet supported Sitting balance-Leahy Scale: Good     Standing balance support: Bilateral upper extremity supported Standing balance-Leahy Scale: Poor                              ADL Overall ADL's : Needs assistance/impaired Eating/Feeding: Independent;Sitting   Grooming: Wash/dry hands;Standing;Min  guard   Upper Body Bathing: Set up;Sitting   Lower Body Bathing: Minimal assistance;Sit to/from stand   Upper Body Dressing : Set up;Sitting   Lower Body Dressing: Minimal assistance;Sit to/from stand Lower Body Dressing Details (indicate cue type and reason): instructed to dress operated leg first and undress it last Toilet Transfer: Min guard;RW;Ambulation (stood to urinate)   Toileting- Water quality scientist and Hygiene: Min guard;Sit to/from stand       Functional mobility during ADLs: Min guard;Rolling walker General ADL Comments: Educated pt and wife in availability and use of AE for LB ADL. Instructed in transporting items with RW, safe footwear, multiple uses of 3 in1.  Instructed in seat height at home to maintain 90 degree angle.      Vision                     Perception     Praxis      Pertinent Vitals/Pain Pain Assessment: 0-10 Pain Score: 8  Pain Location: R hip Pain Descriptors / Indicators: Aching Pain Intervention(s): Limited activity within patient's tolerance;Monitored during session;Repositioned;Patient requesting pain meds-RN notified     Hand Dominance Right   Extremity/Trunk Assessment Upper Extremity Assessment Upper Extremity Assessment: Overall WFL for tasks assessed   Lower Extremity Assessment Lower Extremity Assessment: Defer to PT evaluation   Cervical / Trunk Assessment Cervical / Trunk Assessment: Normal   Communication Communication Communication: No difficulties   Cognition Arousal/Alertness: Lethargic;Suspect due to medications Behavior During Therapy: Flat affect Overall Cognitive Status: Within  Functional Limits for tasks assessed                     General Comments       Exercises      Shoulder Instructions      Home Living Family/patient expects to be discharged to:: Private residence Living Arrangements: Spouse/significant other;Children Available Help at Discharge: Family;Available 24 hours/day  (for 1 week) Type of Home: House Home Access: Stairs to enter [employer] of Steps: 4 Entrance Stairs-Rails: Right;Left;Can reach both Home Layout: One level     Bathroom Shower/Tub: Teacher, early years/pre: Standard     Home Equipment: Cane - single point          Prior Functioning/Environment Level of Independence: Independent with assistive device(s)        Comments: used cane PTA    OT Diagnosis: Generalized weakness;Acute pain   OT Problem List: Decreased strength;Decreased activity tolerance;Impaired balance (sitting and/or standing);Decreased knowledge of use of DME or AE;Obesity;Pain;Decreased knowledge of precautions   OT Treatment/Interventions: Self-care/ADL training;DME and/or AE instruction;Therapeutic activities;Patient/family education    OT Goals(Current goals can be found in the care plan section) Acute Rehab OT Goals Patient Stated Goal: to go home, decrease pain OT Goal Formulation: With patient Time For Goal Achievement: 04/05/14 Potential to Achieve Goals: Good ADL Goals Pt Will Perform Grooming: with supervision;standing Pt Will Perform Lower Body Bathing: with supervision;with adaptive equipment;sit to/from stand Pt Will Perform Lower Body Dressing: with supervision;with adaptive equipment;sit to/from stand Pt Will Transfer to Toilet: with supervision;ambulating;bedside commode (over toilet) Pt Will Perform Toileting - Clothing Manipulation and hygiene: with supervision;sit to/from stand Pt Will Perform Tub/Shower Transfer: Tub transfer;ambulating;shower seat;rolling walker Additional ADL Goal #1: Pt will generalize posterior hip precautions in ADL and mobility independently.  OT Frequency: Min 2X/week   Barriers to D/C:            Co-evaluation              End of Session Equipment Utilized During Treatment: Surveyor, mining Communication: Patient requests pain meds  Activity Tolerance: Patient limited by  lethargy;Patient limited by pain Patient left: in chair;with call bell/phone within reach;with family/visitor present   Time: 1349-1415 OT Time Calculation (min): 26 min Charges:  OT General Charges $OT Visit: 1 Procedure OT Evaluation $Initial OT Evaluation Tier I: 1 Procedure OT Treatments $Self Care/Home Management : 8-22 mins G-Codes:    Malka So 03/29/2014, 2:46 PM  [phone]

## 2014-03-29 NOTE — Progress Notes (Signed)
Patient ID: Jerry Hart, male   DOB: February 02, 1971, 43 y.o.   MRN: [id number] PATIENT ID: Jerry Hart  MRN: [id number]  DOB/AGE:  12-Nov-1970 / 43 y.o.  1 Day Post-Op Procedure(s) (LRB): RIGHT TOTAL HIP ARTHROPLASTY (Right)    PROGRESS NOTE Subjective: Patient is alert, oriented, no Nausea, no Vomiting, yes passing gas, no Bowel Movement. Taking PO well. Denies SOB, Chest or Calf Pain. Using Incentive Spirometer, PAS in place. Ambulate WBAT Patient reports pain as 6 on 0-10 scale  .    Objective: Vital signs in last 24 hours: Filed Vitals:   03/28/14 1800 03/28/14 2110 03/29/14 0140 03/29/14 0653  BP: 108/70 122/66 103/62 125/68  Pulse: 88 97 103 98  Temp: 97.9 F (36.6 C) 98.5 F (36.9 C) 98.8 F (37.1 C) 98.7 F (37.1 C)  TempSrc: Oral     Resp: 16 16 16 16   Height:      Weight:      SpO2: 94% 96% 95% 96%      Intake/Output from previous day: I/O last 3 completed shifts: In: 2570 [P.O.:120; I.V.:2200; IV Piggyback:250] Out: 400 [Blood:400]   Intake/Output this shift:     LABORATORY DATA:  Recent Labs  03/28/14 1421 03/28/14 2136 03/29/14 0500 03/29/14 0634  WBC  --   --  10.1  --   HGB  --   --  11.6*  --   HCT  --   --  36.8*  --   PLT  --   --  234  --   NA  --   --  127*  --   K  --   --  4.5  --   CL  --   --  96  --   CO2  --   --  23  --   BUN  --   --  17  --   CREATININE  --   --  1.82*  --   GLUCOSE  --   --  363*  --   GLUCAP 272* 296*  --  335*  CALCIUM  --   --  8.5  --     Examination: Neurologically intact ABD soft Neurovascular intact Sensation intact distally Intact pulses distally Dorsiflexion/Plantar flexion intact Incision: scant drainage No cellulitis present Compartment soft} XR AP&Lat of hip shows well placed\fixed THA  Assessment:   1 Day Post-Op Procedure(s) (LRB): RIGHT TOTAL HIP ARTHROPLASTY (Right) ADDITIONAL DIAGNOSIS:  Expected Acute Blood Loss Anemia, Diabetes  Plan: PT/OT WBAT, THA  posterior precautions,  on coverage insulin  DVT Prophylaxis: SCDx72 hrs, ASA 325 mg BID x 2 weeks  DISCHARGE PLAN: Home  DISCHARGE NEEDS: HHPT, HHRN, CPM, Walker and 3-in-1 comode seat

## 2014-03-29 NOTE — Progress Notes (Signed)
Physical Therapy Treatment Patient Details Name: Jerry Hart MRN: [id number] DOB: [birth year] Today's Date: 03/29/2014    History of Present Illness 43 y.o. male admitted to [facility] on 03/28/14 for elective R posterior THA.  Pt with significant PMHx of HTN, DM, DOE, gout, R hip pinning 1985, and cardiac cath 2014.     PT Comments    Pt is POD #1 and still pretty lethargic at rest.  He reports lightheadedness with standing and gait, but was able to progress to hallway ambulation despite.  Hip precautions reinforced and exercise packet review initiated.  Plan to initiate stair training this PM.    Follow Up Recommendations  Home health PT;Supervision for mobility/OOB     Equipment Recommendations  Rolling walker with 5" wheels    Recommendations for Other Services   NA     Precautions / Restrictions Precautions Precautions: Posterior Hip Precaution Booklet Issued: Yes (comment) Precaution Comments: Reviewed flexion and internal rotation precaution, will need to reinforce no crossing of legs in PM session.  Restrictions Weight Bearing Restrictions: Yes RLE Weight Bearing: Weight bearing as tolerated    Mobility                    Transfers Overall transfer level: Needs assistance Equipment used: Rolling walker (2 wheeled) Transfers: Sit to/from Stand Sit to Stand: Min guard         General transfer comment: Min guard assist for stafety and to stabilize RW during transition of hands from armrests on chair to RW.   Ambulation/Gait Ambulation/Gait assistance: Min guard Ambulation Distance (Feet): 100 Feet Assistive device: Rolling walker (2 wheeled) Gait Pattern/deviations: Step-to pattern;Antalgic Gait velocity: decreased Gait velocity interpretation: Below normal speed for age/gender General Gait Details: Pt with moderately antalgic gait pattern.  He seems to mildly vaulting over his right leg during swing phase on the left.  Verbal cues to keep eyes open as pt  reports lightheadedness with gait and standing, but was able to complete gait without event.           Balance Overall balance assessment: Needs assistance Sitting-balance support: No upper extremity supported;Feet supported Sitting balance-Leahy Scale: Good     Standing balance support: Bilateral upper extremity supported Standing balance-Leahy Scale: Poor                      Cognition Arousal/Alertness: Lethargic Behavior During Therapy: WFL for tasks assessed/performed Overall Cognitive Status: Within Functional Limits for tasks assessed                      Exercises Total Joint Exercises Ankle Circles/Pumps: AROM;Both;10 reps;Supine Quad Sets: AROM;Right;10 reps;Supine Short Arc Quad: AROM;Right;10 reps;Supine Heel Slides: AAROM;Right;10 reps;Supine Hip ABduction/ADduction: AAROM;Right;10 reps;Supine Long Arc Quad: AROM;Right;10 reps;Supine        Pertinent Vitals/Pain Pain Assessment: 0-10 Pain Score: 8  Pain Location: right hip Pain Descriptors / Indicators: Aching;Burning Pain Intervention(s): Limited activity within patient's tolerance;Monitored during session;Premedicated before session;Repositioned           PT Goals (current goals can now be found in the care plan section) Acute Rehab PT Goals Patient Stated Goal: to go home, decrease pain Progress towards PT goals: Progressing toward goals    Frequency  7X/week    PT Plan Current plan remains appropriate       End of Session   Activity Tolerance: Patient limited by fatigue Patient left: in chair;with call bell/phone within reach;with family/visitor present  Time: 0802-2336 PT Time Calculation (min) (ACUTE ONLY): 23 min  Charges:  $Gait Training: 8-22 mins $Therapeutic Exercise: 8-22 mins                      Almon Whitford B. Minka Knight, PT, DPT [phone]   03/29/2014, 12:42 PM

## 2014-03-29 NOTE — Clinical Documentation Improvement (Signed)
  Abnormal findings (laboratory, x-ray, MRI/CT scans, and other diagnostic results) are not coded and reported unless the physician indicates their clinical significance. If possible, please help by clarifying the specific medical condition related to these abnormal laboratory findings.  Please take into consideration the patient's IV fluids of D51/2NS at 173mls/hour   Component Sodium  Latest Ref Rng 135 - 145 mmol/L  03/18/2014 135 (L)  03/29/2014 127 (L)   Component Creatinine  Latest Ref Rng 0.50 - 1.35 mg/dL  03/18/2014 0.69  03/29/2014 1.82 (H)    Thank you for your time with this!   Almon Register, RN Clinical Documentation Improvement Specialist ([phone] / [phone]

## 2014-03-30 LAB — CBC
HEMATOCRIT: 30 % — AB (ref 39.0–52.0)
Hemoglobin: 9.8 g/dL — ABNORMAL LOW (ref 13.0–17.0)
MCH: 25.2 pg — ABNORMAL LOW (ref 26.0–34.0)
MCHC: 32.7 g/dL (ref 30.0–36.0)
MCV: 77.1 fL — ABNORMAL LOW (ref 78.0–100.0)
PLATELETS: 158 10*3/uL (ref 150–400)
RBC: 3.89 MIL/uL — AB (ref 4.22–5.81)
RDW: 15.1 % (ref 11.5–15.5)
WBC: 9.1 10*3/uL (ref 4.0–10.5)

## 2014-03-30 LAB — GLUCOSE, CAPILLARY
GLUCOSE-CAPILLARY: 109 mg/dL — AB (ref 70–99)
GLUCOSE-CAPILLARY: 170 mg/dL — AB (ref 70–99)
GLUCOSE-CAPILLARY: 128 mg/dL — AB (ref 70–99)

## 2014-03-30 NOTE — Progress Notes (Signed)
Physical Therapy Treatment Patient Details Name: Jerry Hart MRN: [id number] DOB: Nov 13, 1970 Today's Date: 03/30/2014    History of Present Illness 43 y.o. male admitted to [facility] on 03/28/14 for elective R posterior THA.  Pt with significant PMHx of HTN, DM, DOE, gout, R hip pinning 1985, and cardiac cath 2014.     PT Comments    Pt is progressing well with increased gait speed and confidence.  Still some reinforcement needed for hip precautions, but I believe some of this is related to lethargy (likely due to pain meds).  Pt anticipates d/c home this afternoon.  PT will continue to follow acutely until d/c.  Follow Up Recommendations  Home health PT;Supervision for mobility/OOB     Equipment Recommendations  Rolling walker with 5" wheels;Other (comment) (axillary crutches)    Recommendations for Other Services   NA     Precautions / Restrictions Precautions Precautions: Posterior Hip Precaution Comments: Pt able to report 2/3 hip precautions.  Verbally reviewed the last and answered questions about what he can and cannot do.  Restrictions RLE Weight Bearing: Weight bearing as tolerated    Mobility                    Transfers Overall transfer level: Needs assistance Equipment used: Rolling walker (2 wheeled) Transfers: Sit to/from Stand Sit to Stand: Modified independent (Device/Increase time);Supervision         General transfer comment: Mod I from higher surfaces, supervision from low recliner chair.   Ambulation/Gait Ambulation/Gait assistance: Supervision Ambulation Distance (Feet): 120 Feet Assistive device: Rolling walker (2 wheeled) Gait Pattern/deviations: Step-to pattern;Antalgic Gait velocity: decreased Gait velocity interpretation: Below normal speed for age/gender General Gait Details: Pt's gait continues to be more confident and less antalgic with each session.  Verbal cues for safe RW use (not too close to the front) and chair to follow due  to lightheadedness in standing.            Balance Overall balance assessment: Needs assistance Sitting-balance support: Feet supported;No upper extremity supported Sitting balance-Leahy Scale: Good     Standing balance support: Bilateral upper extremity supported;No upper extremity supported;Single extremity supported Standing balance-Leahy Scale: Good                      Cognition Arousal/Alertness: Lethargic Behavior During Therapy: Flat affect Overall Cognitive Status: Within Functional Limits for tasks assessed                      Exercises Total Joint Exercises Ankle Circles/Pumps: AROM;Both;10 reps;Supine Quad Sets: AROM;Right;10 reps;Supine Short Arc Quad: AROM;Right;10 reps;Supine Heel Slides: AAROM;Right;10 reps;Supine Hip ABduction/ADduction: AAROM;Right;10 reps;Supine Long Arc Quad: AROM;Right;10 reps;Supine        Pertinent Vitals/Pain Pain Assessment: 0-10 Pain Score: 8  Pain Location: rigt hip Pain Descriptors / Indicators: Aching;Burning Pain Intervention(s): Limited activity within patient's tolerance;Monitored during session;Repositioned           PT Goals (current goals can now be found in the care plan section) Acute Rehab PT Goals Patient Stated Goal: to go home, decrease pain Progress towards PT goals: Progressing toward goals    Frequency  7X/week    PT Plan Current plan remains appropriate       End of Session   Activity Tolerance: Patient limited by fatigue;Patient limited by pain Patient left: in chair;with call bell/phone within reach     Time: 5364-6803 PT Time Calculation (min) (ACUTE ONLY): 25 min  Charges:  $Gait Training: 8-22 mins $Therapeutic Exercise: 8-22 mins                     Caroline Matters B. Walton Digilio, PT, DPT [phone]   03/30/2014, 3:18 PM

## 2014-03-30 NOTE — Discharge Summary (Signed)
Patient ID: Jerry Hart MRN: [id number] DOB/AGE: 1970/12/30 43 y.o.  Admit date: 03/28/2014 Discharge date: 03/30/2014  Admission Diagnoses:  Active Problems:   Arthritis, hip   Discharge Diagnoses:  Same  Past Medical History  Diagnosis Date  . Chest pain 08/14/2012  . Hypertension   . Diabetes mellitus without complication     TYPE 2  . DOE (dyspnea on exertion)     whenever he is working hard  . Gout     left fingers  . Arthritis     in his hip    Surgeries: Procedure(s): RIGHT TOTAL HIP ARTHROPLASTY on 03/28/2014   Consultants:    Discharged Condition: Improved  Hospital Course: Jerry Hart is an 43 y.o. male who was admitted 03/28/2014 for operative treatment of<principal problem not specified>. Patient has severe unremitting pain that affects sleep, daily activities, and work/hobbies. After pre-op clearance the patient was taken to the operating room on 03/28/2014 and underwent  Procedure(s): RIGHT TOTAL HIP ARTHROPLASTY.    Patient was given perioperative antibiotics: Anti-infectives    Start     Dose/Rate Route Frequency Ordered Stop   03/28/14 0600  ceFAZolin (ANCEF) 3 g in dextrose 5 % 50 mL IVPB     3 g160 mL/hr over 30 Minutes Intravenous On call to O.R. 03/27/14 1416 03/28/14 0918       Patient was given sequential compression devices, early ambulation, and chemoprophylaxis to prevent DVT.  Patient benefited maximally from hospital stay and there were no complications.    Recent vital signs: Patient Vitals for the past 24 hrs:  BP Temp Temp src Pulse Resp SpO2  03/30/14 0641 (!) 92/56 mmHg 99.2 F (37.3 C) Oral 98 16 97 %  03/29/14 2140 (!) 90/50 mmHg 99.3 F (37.4 C) Oral (!) 105 16 94 %  03/29/14 1425 108/63 mmHg 99 F (37.2 C) - (!) 102 18 100 %  03/29/14 1049 (!) 101/57 mmHg - - 91 - -     Recent laboratory studies:  Recent Labs  03/29/14 0500  WBC 10.1  HGB 11.6*  HCT 36.8*  PLT 234  NA 127*  K 4.5  CL 96  CO2 23  BUN 17   CREATININE 1.82*  GLUCOSE 363*  CALCIUM 8.5     Discharge Medications:     Medication List    TAKE these medications        acetaminophen 325 MG tablet  Commonly known as:  TYLENOL  Take 2 tablets (650 mg total) by mouth every 4 (four) hours as needed.     amLODipine 5 MG tablet  Commonly known as:  NORVASC  Take 1 tablet (5 mg total) by mouth daily.     aspirin EC 325 MG tablet  Take 1 tablet (325 mg total) by mouth 2 (two) times daily.     AZOR 5-40 MG per tablet  Generic drug:  amLODipine-olmesartan  Take 1 tablet by mouth daily.     colchicine 0.6 MG tablet  2 now and one in one hour.  Tomorrow 1 po daily     glimepiride 4 MG tablet  Commonly known as:  AMARYL  Take 4 mg by mouth daily with breakfast.     HYDROcodone-acetaminophen 5-325 MG per tablet  Commonly known as:  NORCO/VICODIN  Take 1 tablet by mouth daily.     insulin glargine 100 UNIT/ML injection  Commonly known as:  LANTUS  Inject 0.1 mLs (10 Units total) into the skin at bedtime.  linagliptin 5 MG Tabs tablet  Commonly known as:  TRADJENTA  Take 5 mg by mouth daily.     lisinopril 5 MG tablet  Commonly known as:  PRINIVIL,ZESTRIL  Take 1 tablet (5 mg total) by mouth daily.     meloxicam 15 MG tablet  Commonly known as:  MOBIC  Take 15 mg by mouth daily.     metFORMIN 1000 MG tablet  Commonly known as:  GLUCOPHAGE  Take 1,000 mg by mouth 2 (two) times daily with a meal.     methocarbamol 500 MG tablet  Commonly known as:  ROBAXIN  Take 1 tablet (500 mg total) by mouth 2 (two) times daily with a meal.     metoprolol tartrate 12.5 mg Tabs tablet  Commonly known as:  LOPRESSOR  Take 0.5 tablets (12.5 mg total) by mouth 2 (two) times daily.     nitroGLYCERIN 0.4 MG SL tablet  Commonly known as:  NITROSTAT  Place 1 tablet (0.4 mg total) under the tongue every 5 (five) minutes x 3 doses as needed for chest pain.     oxyCODONE-acetaminophen 5-325 MG per tablet  Commonly known as:   ROXICET  Take 1 tablet by mouth every 4 (four) hours as needed.     rosuvastatin 5 MG tablet  Commonly known as:  CRESTOR  Take 1 tablet (5 mg total) by mouth daily.        Diagnostic Studies: Dg Chest 2 View  03/18/2014   CLINICAL DATA:  Preop right hip surgery.  EXAM: CHEST  2 VIEW  COMPARISON:  08/15/2012  FINDINGS: Lung volumes are low. The cardiomediastinal contours are unchanged, heart at the upper limits of normal in size. The lungs are clear. Pulmonary vasculature is normal. No consolidation, pleural effusion, or pneumothorax. No acute osseous abnormalities are seen.  IMPRESSION: No acute pulmonary process.   Electronically Signed   By: Jeb Levering M.D.   On: 03/18/2014 13:57   Dg Pelvis Portable  03/28/2014   CLINICAL DATA:  Total right hip arthroplasty.  EXAM: PORTABLE PELVIS 1-2 VIEWS  COMPARISON:  None.  FINDINGS: Changes of right hip replacement. Normal AP alignment. No hardware or bony complicating feature.  IMPRESSION: Right hip replacement without visible complicating feature.   Electronically Signed   By: Rolm Baptise M.D.   On: 03/28/2014 12:40   Nm Myocar Multi W/spect W/wall Motion / Ef  03/21/2014   CLINICAL DATA:  Chest pain with hypertension and diabetes.  EXAM: MYOCARDIAL IMAGING WITH SPECT (REST AND PHARMACOLOGIC-STRESS)  GATED LEFT VENTRICULAR WALL MOTION STUDY  LEFT VENTRICULAR EJECTION FRACTION  TECHNIQUE: Standard myocardial SPECT imaging was performed after resting intravenous injection of 10 mCi Tc-67m sestamibi. Subsequently, intravenous infusion of Lexiscan was performed under the supervision of the Cardiology staff. At peak effect of the drug, 30 mCi Tc-16m sestamibi was injected intravenously and standard myocardial SPECT imaging was performed. Quantitative gated imaging was also performed to evaluate left ventricular wall motion, and estimate left ventricular ejection fraction.  COMPARISON:  Myocardial perfusion exam 08/15/2012  FINDINGS: Perfusion: A  small fixed defect within the anterior septal wall. No evidence of reversible ischemia.  Wall Motion: Global hypokinesia.  No left ventricular dilation.  Left Ventricular Ejection Fraction: 33 %. This decreased from 43% on 09/62/8366  End diastolic volume 294 ml  End systolic volume 765 ml  IMPRESSION: 1. No reversible ischemia.  Small anterior septal infarct.  2. Global hypokinesia.  3. Left ventricular ejection fraction 33%  4. High-risk stress  test findings*.  *2012 Appropriate Use Criteria for Coronary Revascularization Focused Update: J Am Coll Cardiol. 5035;46(5):681-275. http://content.airportbarriers.com.aspx?articleid=1201161   Electronically Signed   By: Suzy Bouchard M.D.   On: 03/21/2014 11:54    Disposition: 01-Home or Self Care      Discharge Instructions    Call MD / Call 911    Complete by:  As directed   If you experience chest pain or shortness of breath, CALL 911 and be transported to the hospital emergency room.  If you develope a fever above 101 F, pus (white drainage) or increased drainage or redness at the wound, or calf pain, call your surgeon's office.     Change dressing    Complete by:  As directed   You may change your dressing on day 5, then change the dressing daily with sterile 4 x 4 inch gauze dressing and paper tape.  You may clean the incision with alcohol prior to redressing     Constipation Prevention    Complete by:  As directed   Drink plenty of fluids.  Prune juice may be helpful.  You may use a stool softener, such as Colace (over the counter) 100 mg twice a day.  Use MiraLax (over the counter) for constipation as needed.     Diet - low sodium heart healthy    Complete by:  As directed      Discharge instructions    Complete by:  As directed   Follow up in office with Dr. Mayer Camel in 2 weeks.     Driving restrictions    Complete by:  As directed   No driving for 2 weeks     Follow the hip precautions as taught in Physical Therapy    Complete by:  As  directed      Increase activity slowly as tolerated    Complete by:  As directed      Patient may shower    Complete by:  As directed   You may shower without a dressing once there is no drainage.  Do not wash over the wound.  If drainage remains, cover wound with plastic wrap and then shower.           Follow-up Information    Follow up with Kerin Salen, MD In 2 weeks.   Specialty:  Orthopedic Surgery   Contact information:   [address] [place] [address] [phone]       Follow up with [facility].   Why:  They will contact you to schedule home physical therapy visits.   Contact information:   [address] [place] [address] [phone]        Signed: Theodosia Quay 03/30/2014, 7:53 AM

## 2014-03-30 NOTE — Progress Notes (Signed)
PATIENT ID: Jerry Hart  MRN: [id number]  DOB/AGE:  [birth year] / 43 y.o.  2 Days Post-Op Procedure(s) (LRB): RIGHT TOTAL HIP ARTHROPLASTY (Right)    PROGRESS NOTE Subjective: Patient is alert, oriented, no Nausea, no Vomiting, yes passing gas, no Bowel Movement. Taking PO with small bites. Denies SOB, Chest or Calf Pain. Using Incentive Spirometer, PAS in place. Ambulate WBAT Patient reports pain as 8 on 0-10 scale  .    Objective: Vital signs in last 24 hours: Filed Vitals:   03/29/14 1049 03/29/14 1425 03/29/14 2140 03/30/14 0641  BP: 101/57 108/63 90/50 92/56   Pulse: 91 102 105 98  Temp:  99 F (37.2 C) 99.3 F (37.4 C) 99.2 F (37.3 C)  TempSrc:   Oral Oral  Resp:  18 16 16   Height:      Weight:      SpO2:  100% 94% 97%      Intake/Output from previous day: I/O last 3 completed shifts: In: 1240 [P.O.:240; I.V.:1000] Out: -    Intake/Output this shift:     LABORATORY DATA:  Recent Labs  03/29/14 0500  03/29/14 1600 03/29/14 2141 03/30/14 0644  WBC 10.1  --   --   --   --   HGB 11.6*  --   --   --   --   HCT 36.8*  --   --   --   --   PLT 234  --   --   --   --   NA 127*  --   --   --   --   K 4.5  --   --   --   --   CL 96  --   --   --   --   CO2 23  --   --   --   --   BUN 17  --   --   --   --   CREATININE 1.82*  --   --   --   --   GLUCOSE 363*  --   --   --   --   GLUCAP  --   < > 271* 184* 109*  CALCIUM 8.5  --   --   --   --   < > = values in this interval not displayed.  Examination: Neurologically intact Neurovascular intact Sensation intact distally Intact pulses distally Dorsiflexion/Plantar flexion intact Incision: scant drainage No cellulitis present Compartment soft} XR AP&Lat of hip shows well placed\fixed THA  Assessment:   2 Days Post-Op Procedure(s) (LRB): RIGHT TOTAL HIP ARTHROPLASTY (Right) ADDITIONAL DIAGNOSIS:  Expected Acute Blood Loss Anemia, Diabetes  Plan: PT/OT WBAT, THA  posterior precautions  DVT  Prophylaxis: SCDx72 hrs, ASA 325 mg BID x 2 weeks  DISCHARGE PLAN: Home, later today after therapy  DISCHARGE NEEDS: HHPT, HHRN, Walker and 3-in-1 comode seat

## 2014-03-30 NOTE — Progress Notes (Signed)
Physical Therapy Treatment Patient Details Name: Jerry Hart MRN: [id number] DOB: July 15, 1970 Today's Date: 03/30/2014    History of Present Illness 43 y.o. male admitted to [facility] on 03/28/14 for elective R posterior THA.  Pt with significant PMHx of HTN, DM, DOE, gout, R hip pinning 1985, and cardiac cath 2014.     PT Comments    Pt is progressing well with his mobility. He is putting more weight on right leg during gait with RW and his speed and confidence has improved.  Pt was able to demonstrate, with cues home entry with one rail and one crutch.  Crutches for home use were adjusted up to his height.  He was also able to get his own leg into the bed using a blanket as a leg lifter.  If pt still here later this afternoon we will do the rest of his hip exercises, gait, and reinforce his hip precautions.    Follow Up Recommendations  Home health PT;Supervision for mobility/OOB     Equipment Recommendations  Rolling walker with 5" wheels;Other (comment) (axillary crutches)    Recommendations for Other Services   NA     Precautions / Restrictions Precautions Precautions: Posterior Hip Restrictions Weight Bearing Restrictions: Yes RLE Weight Bearing: Weight bearing as tolerated    Mobility  Bed Mobility Overal bed mobility: Needs Assistance Bed Mobility: Supine to Sit;Sit to Supine     Supine to sit: Modified independent (Device/Increase time) Sit to supine: Supervision   General bed mobility comments: Verbal cues to help him strategize getting back in bed and leg loop (blanket) given to assist him in lifting his own leg.   Transfers Overall transfer level: Needs assistance Equipment used: Rolling walker (2 wheeled) Transfers: Sit to/from Stand Sit to Stand: Supervision         General transfer comment: supervision for safety from low surfaces, mod I from higher surfaces.   Ambulation/Gait Ambulation/Gait assistance: Supervision Ambulation Distance (Feet): 105  Feet Assistive device: Rolling walker (2 wheeled) Gait Pattern/deviations: Step-to pattern;Antalgic Gait velocity: decreased Gait velocity interpretation: Below normal speed for age/gender General Gait Details: Pt is starting to put a little more weight on his right foot during gait today.  Speed mildly improved.     Stairs Stairs: Yes Stairs assistance: Min guard Stair Management: One rail Right;Step to pattern;Forwards;With crutches Number of Stairs: 4 General stair comments: Verbal cues to reinforce safest LE sequencing.  Min guard assist for safety and balance.  Verbal cues to make sure he is wearing better shoes (not slippers) when doing stairs at home, especially since it is raining today.       Balance Overall balance assessment: Needs assistance Sitting-balance support: Feet supported;No upper extremity supported Sitting balance-Leahy Scale: Good     Standing balance support: Bilateral upper extremity supported;No upper extremity supported;Single extremity supported Standing balance-Leahy Scale: Good                      Cognition Arousal/Alertness: Awake/alert Behavior During Therapy: Flat affect Overall Cognitive Status: Within Functional Limits for tasks assessed                      Exercises Total Joint Exercises Hip ABduction/ADduction: AROM;Right;10 reps;Standing Knee Flexion: AROM;Right;10 reps;Standing Marching in Standing: AROM;Right;10 reps;Standing Standing Hip Extension: AROM;Right;10 reps;Standing        Pertinent Vitals/Pain Pain Assessment: 0-10 Pain Score: 7  Pain Location: right hip Pain Descriptors / Indicators: Aching;Burning Pain Intervention(s): Limited activity  within patient's tolerance;Monitored during session;Repositioned           PT Goals (current goals can now be found in the care plan section) Acute Rehab PT Goals Patient Stated Goal: to go home, decrease pain Progress towards PT goals: Progressing toward  goals    Frequency  7X/week    PT Plan Current plan remains appropriate       End of Session   Activity Tolerance: Patient limited by pain;Patient limited by fatigue Patient left: in chair;with call bell/phone within reach;with family/visitor present     Time: 0454-0981 PT Time Calculation (min) (ACUTE ONLY): 31 min  Charges:  $Gait Training: 8-22 mins $Therapeutic Activity: 8-22 mins                      Igor Bishop B. Derrek Puff, PT, DPT [phone]   03/30/2014, 10:33 AM

## 2014-03-30 NOTE — Progress Notes (Signed)
Occupational Therapy Treatment Patient Details Name: Jerry Hart MRN: [id number] DOB: Sep 26, 1970 Today's Date: 03/30/2014    History of present illness 43 y.o. male admitted to [facility] on 03/28/14 for elective R posterior THA.  Pt with significant PMHx of HTN, DM, DOE, gout, R hip pinning 1985, and cardiac cath 2014.    OT comments  Pt with plans to discharge this afternoon.  Focus of session on educating pt in hip precautions related to ADL and ADL transfers.  Improved mobility, but not ready for tub transfer.  Instructed in availability of tub transfer bench as an alternative to stepping over the edge of tub.  Will defer to [facility].  Follow Up Recommendations       Equipment Recommendations       Recommendations for Other Services      Precautions / Restrictions Precautions Precautions: Posterior Hip Precaution Comments: Pt able to state hip precautions, but does not consistently generalize in ADL. Restrictions RLE Weight Bearing: Weight bearing as tolerated       Mobility Bed Mobility                  Transfers Overall transfer level: Needs assistance Equipment used: Rolling walker (2 wheeled) Transfers: Sit to/from Stand Sit to Stand: Modified independent (Device/Increase time);Supervision         General transfer comment: Mod I from higher surfaces, supervision from low recliner chair.     Balance Overall balance assessment: Needs assistance Sitting-balance support: Feet supported;No upper extremity supported Sitting balance-Leahy Scale: Good     Standing balance support: Bilateral upper extremity supported;No upper extremity supported;Single extremity supported Standing balance-Leahy Scale: Good                     ADL Overall ADL's : Needs assistance/impaired     Grooming: Wash/dry hands;Supervision/safety;Standing                   Toilet Transfer: Consulting civil engineer Details (indicate cue type and reason): stood  to urinate Toileting- Water quality scientist and Hygiene: Supervision/safety       Functional mobility during ADLs: Supervision/safety;Rolling walker General ADL Comments: Pt will rely on his wife for LB bathing and dressing, no interest in practicing AE. Wife in agreement.  Pt made aware that he is not to break the precautions until MD approves. Pt not ready to practice tub transfers, but agreed to do with [facility] therapist prior to trying it on his own.      Vision                     Perception     Praxis      Cognition   Behavior During Therapy: Flat affect Overall Cognitive Status: Within Functional Limits for tasks assessed                       Extremity/Trunk Assessment               Exercises   Shoulder Instructions       General Comments      Pertinent Vitals/ Pain       Pain Assessment: 0-10 Pain Score: 8  Pain Location: R hip Pain Descriptors / Indicators: Aching Pain Intervention(s): Limited activity within patient's tolerance;Premedicated before session;Repositioned  Home Living  Prior Functioning/Environment              Frequency Min 2X/week     Progress Toward Goals  OT Goals(current goals can now be found in the care plan section)     Acute Rehab OT Goals Patient Stated Goal: to go home, decrease pain  Plan Discharge plan remains appropriate    Co-evaluation                 End of Session     Activity Tolerance Patient limited by pain;Patient limited by lethargy   Patient Left in chair;with call bell/phone within reach;with family/visitor present   Nurse Communication          Time: 1546-1600 OT Time Calculation (min): 14 min  Charges: OT General Charges $OT Visit: 1 Procedure OT Treatments $Self Care/Home Management : 8-22 mins  Malka So 03/30/2014, 4:10 PM  [phone]

## 2014-03-30 NOTE — Progress Notes (Signed)
[name] discharged home per MD order. Discharge instructions reviewed and discussed with patient. All questions and concerns answered. Copy of instructions and scripts given to patient. IV removed.  Patient escorted to car by staff in a wheelchair. No distress noted upon discharge.   Tarri Abernethy R 03/30/2014 5:12 PM

## 2014-06-14 ENCOUNTER — Encounter: Payer: Self-pay | Admitting: Family Medicine

## 2014-06-14 ENCOUNTER — Ambulatory Visit (INDEPENDENT_AMBULATORY_CARE_PROVIDER_SITE_OTHER): Payer: Managed Care, Other (non HMO) | Admitting: Family Medicine

## 2014-06-14 VITALS — BP 137/88 | HR 86 | Temp 98.2°F | Resp 16 | Ht 75.0 in | Wt 317.0 lb

## 2014-06-14 DIAGNOSIS — Z024 Encounter for examination for driving license: Secondary | ICD-10-CM

## 2014-06-14 DIAGNOSIS — Z021 Encounter for pre-employment examination: Secondary | ICD-10-CM

## 2014-06-15 NOTE — Progress Notes (Addendum)
Erroneous encounter

## 2014-09-09 NOTE — Addendum Note (Signed)
Addended by: Fara Chute on: 09/09/2014 03:04 PM   Modules accepted: Level of Service

## 2014-09-10 NOTE — Progress Notes (Signed)
This patient presents for DOT examination for fitness for duty.   Medical History: yes  Any illness or injury in the last 5 years? - h/o HTN and Dm and hip replacement 03/28/2014 no  Head/Brain Injuries, disorders or illnesses no  Seizures, epilepsy no  Eye disorders or impaired vision (except corrective lenses) no  Ear disorders, loss of hearing or balance yes  Heart disease or heart attack; other cardiovascular condition - HTN - treated with medications - uncontrolled no  Heart surgery (valve replacement/bypass, angioplasty, pacemaker) yes  High blood pressure no  Muscular disease no  Shortness of breath no  Lung disease, emphysema, asthma, chronic bronchitis no  Kidney disease, dialysis no  Liver disease no  Digestive problems yes  Diabetes or elevated blood sugar- on medications - uncontrolled no  Nervious or psychiatric disorders, e.g., severe depression no  Loss of, or altered consciousness no  Fainting, dizziness no  Sleep disorders, pauses in breathing while asleep, daytime sleepiness, loud snoring no  Stroke or paralysis no  Missing or impaired hand, arm, foot, leg, finger, toe no  Spinal injury or disease no  Chronic low back pain no  Regular, frequent alcohol use no  Narcotic or habit forming drug use  Current Medications: Prior to Admission medications   Medication Sig Start Date End Date Taking? Authorizing Provider  amLODipine-olmesartan (AZOR) 5-40 MG per tablet Take 1 tablet by mouth daily.   Yes Historical Provider, MD  glimepiride (AMARYL) 4 MG tablet Take 4 mg by mouth daily with breakfast.   Yes Historical Provider, MD  linagliptin (TRADJENTA) 5 MG TABS tablet Take 5 mg by mouth daily.   Yes Historical Provider, MD  meloxicam (MOBIC) 15 MG tablet Take 15 mg by mouth daily.   Yes Historical Provider, MD  metFORMIN (GLUCOPHAGE) 1000 MG tablet Take 1,000 mg by mouth 2 (two) times daily with a meal.   Yes Historical Provider, MD    Primary Care Provider:  Maximino Greenland, MD Specialists: Dr Baird Cancer - PCP and Dr Hal Morales - ortho  Medical Examiner's Comments on Health History:  HTN and DM uncontrolled  TESTING:   Visual Acuity Screening   Right eye Left eye Both eyes  Without correction: 20/25 20/25 20/20   With correction:     Comments: Color 6/6 peripheraL 85 bilateral  Hearing Screening Comments: Wisper; 8 feet bilateral  Monocular Vision: No.  Hearing Aid used for test: No. Hearing Aid required to to meet standard: No. Distance from individual at which forced whispered voice can first be heard:   RIGHT ear 8 feet; LEFT ear 8 feet   BP 137/88 mmHg  Pulse 86  Temp(Src) 98.2 F (36.8 C)  Resp 16  Ht 6\' 3"  (1.905 m)  Wt 317 lb (143.79 kg)  BMI 39.62 kg/m2  SpO2 100% Pulse rate is regular  Urine Specimen: Specific Gravity 1.030, Protein tr. Blood neg, Sugar500  Other Testing:  HbA1C 8.7  PHYSICAL EXAMINATION:  1. No. General Appearance: Marked overweight, tremor, signs of alcoholism, problem drinking or drug abuse. 2. No. Eyes: pupillary equality, reaction to light, accommodation, ocular motility, ocular muscle imbalance, extra ocular movement, nystagmus, exopthalmos. Ask about retinopathy, cataracts, aphakia, glaucoma, macular degeneration and refer to a specialist if appropriate.  3. No. Ears: Scarring of tympanic membrane, occlusion of external canal, perforated eardrums.     4. No. Mouth and Throat: Irremedial deformities likely to interfere with breathing or swallowing.    5. No. Heart: Murmurs, extra sounds, enlarged heart, pacemaker, implantable defibrillator.  6. No. Lungs and Chest, not including breast examination: Abnormal Chest wall expansion, abnormal respiratory rate, abnormal breath sounds including wheezes or alveolar rates, impaired respiratory function, cyanosis. Abnormal findings on physical exam may require further testing such as pulmonary tests and/or x ray of chest.  7. No. Abdomen and Viscera:  Enlarged liver, enlarged spleen, masses, bruits, hernia, significant abdominal wall muscle weakness.  8. No. Vascular System: Abnormal pulse and amplitude, carotid or arterial bruits, varicose veins.    9. No. Genitourinary System: Hernia  10. No. Extremities-Limb impaired: Loss or impairment of leg, foot, toe, arm, hand, finger. Perceptible limp, deformities, atrophy, weakness, paralysis, clubbing, edema, hypotonia. Insufficient grasp and prehension to maintain steering wheel grip. Insufficient mobility and strength in lower limb to operate pedals properly. 11. No. Spine, other musculoskeletal: Previous surgery, deformities, limitation of motion, tenderness.  12. No. Neurological: Impaired equilibrium, coordination or speech pattern; paresthesia, asymmetric deep tendon reflexes, sensory or positional abnormalities, abnormal patellar and Babinski's reflexes, ataxia.     Comments:   Certification Status: does not meet standards for 2 year certificate.  Meets standards, but periodic monitoring required due to: Hypertension and diabetes.  Financial planner only for:6 months  Temporarily disqualified due to   Return to medical examiner's office for follow-up on 6 months  Wearing corrective lenses: no Wearing hearing aid: no Accompanied by a waiver/exemption Skill performance Evaluation (SPE) Certificate: no Driving within an exempt intracity zone: no Qualified by operation of 49 CFR 352.48: no  Certification expires 12/15/2014

## 2015-09-01 ENCOUNTER — Encounter: Payer: Self-pay | Admitting: Physician Assistant

## 2015-09-01 ENCOUNTER — Ambulatory Visit (INDEPENDENT_AMBULATORY_CARE_PROVIDER_SITE_OTHER): Payer: Self-pay | Admitting: Physician Assistant

## 2015-09-01 VITALS — BP 134/82 | HR 83 | Temp 97.6°F | Resp 20 | Ht 75.0 in | Wt 321.0 lb

## 2015-09-01 DIAGNOSIS — Z029 Encounter for administrative examinations, unspecified: Secondary | ICD-10-CM

## 2015-09-01 DIAGNOSIS — Z024 Encounter for examination for driving license: Secondary | ICD-10-CM

## 2015-09-01 LAB — POCT GLYCOSYLATED HEMOGLOBIN (HGB A1C): HEMOGLOBIN A1C: 9.2

## 2015-09-01 NOTE — Progress Notes (Signed)
Commercial Driver Medical Examination   Jerry Hart is a 45 y.o. male who presents today for a commercial driver fitness determination physical exam. The patient reports no problems. He is a diabetic and is taking three oral therapies.  Denies any episodes of hypoglycemia. Denies chest pain, SOB, DOE.     The following portions of the patient's history were reviewed and updated as appropriate: allergies, current medications, past family history, past medical history, past social history, past surgical history and problem list.   Review of Systems Pertinent items are noted in HPI.   Objective:    Vision/hearing:  Visual Acuity Screening   Right eye Left eye Both eyes  Without correction: 20/30 20/30 20/40   With correction:     Comments: Peripheral Vision: Right eye 85 degrees. Left eye 85 degrees.   Hearing Screening Comments: Whisper test was 10 ft in both ears.   Applicant can recognize and distinguish among traffic control signals and devices showing standard red, green, and amber colors.  Corrective lenses required: No  Monocular Vision?: No  Hearing aid requirement: No  Physical Exam  Constitutional: He is oriented to person, place, and time. He appears well-developed. He does not appear ill.  HENT:  Right Ear: Tympanic membrane normal.  Left Ear: Tympanic membrane normal.  Mouth/Throat: Uvula is midline, oropharynx is clear and moist and mucous membranes are normal.  Eyes: Conjunctivae and EOM are normal. Pupils are equal, round, and reactive to light.  Cardiovascular: Normal rate.   Pulmonary/Chest: Effort normal.  Abdominal: He exhibits no distension.  Musculoskeletal: Normal range of motion.  Neurological: He is alert and oriented to person, place, and time. He has normal strength and normal reflexes. No cranial nerve deficit or sensory deficit. Coordination and gait normal. GCS eye subscore is 4. GCS verbal subscore is 5. GCS motor subscore is 6.  Skin: Skin  is warm and dry. He is not diaphoretic.  Psychiatric: He has a normal mood and affect.  Nursing note and vitals reviewed.   BP 134/82 mmHg  Pulse 83  Temp(Src) 97.6 F (36.4 C) (Oral)  Resp 20  Ht 6\' 3"  (1.905 m)  Wt 321 lb (145.605 kg)  BMI 40.12 kg/m2  SpO2 99%  Labs: Comments: spgr:1.020,Glu:2000,PRO:neg,Blood:neg   Lab Results  Component Value Date   HGBA1C 9.2 09/01/2015     Assessment:    Healthy male exam.  Meets standards, but periodic monitoring required due to elevated A1c at 9.2 and history of diabetes.  Driver qualified only for 1 year.    Plan:    Medical examiners certificate completed and printed. Return as needed.

## 2015-09-01 NOTE — Patient Instructions (Signed)
     IF you received an x-ray today, you will receive an invoice from [facility]. Please contact [facility] at [phone] with questions or concerns regarding your invoice.   IF you received labwork today, you will receive an invoice from [facility]. Please contact [facility] at [phone] with questions or concerns regarding your invoice.   Our billing staff will not be able to assist you with questions regarding bills from these companies.  You will be contacted with the lab results as soon as they are available. The fastest way to get your results is to activate your My Chart account. Instructions are located on the last page of this paperwork. If you have not heard from us regarding the results in 2 weeks, please contact this office.      

## 2015-09-27 DIAGNOSIS — G4726 Circadian rhythm sleep disorder, shift work type: Secondary | ICD-10-CM | POA: Diagnosis present

## 2016-09-14 ENCOUNTER — Ambulatory Visit (INDEPENDENT_AMBULATORY_CARE_PROVIDER_SITE_OTHER): Payer: Managed Care, Other (non HMO) | Admitting: Family Medicine

## 2016-09-14 VITALS — BP 136/88 | HR 87 | Temp 98.4°F | Resp 17 | Ht 73.5 in | Wt 306.1 lb

## 2016-09-14 DIAGNOSIS — Z024 Encounter for examination for driving license: Secondary | ICD-10-CM

## 2016-09-14 NOTE — Patient Instructions (Addendum)
Because of previous diagnosis of sleep apnea, you will need to have further evaluation by the sleep specialist to determine if you do need to be on CPAP, or if your symptoms/testing is milder than the past not requiring further evaluation. As we discussed if your AHI is greater than 5, further evaluation was recommended. Because you are are not having any daytime sleepiness, you can continue to drive while you are getting that evaluation.  Let me know if there any questions in the meantime.     IF you received an x-ray today, you will receive an invoice from [facility]. Please contact [facility] at [phone] with questions or concerns regarding your invoice.   IF you received labwork today, you will receive an invoice from [facility]. Please contact [facility] at [phone] with questions or concerns regarding your invoice.   Our billing staff will not be able to assist you with questions regarding bills from these companies.  You will be contacted with the lab results as soon as they are available. The fastest way to get your results is to activate your My Chart account. Instructions are located on the last page of this paperwork. If you have not heard from [place] regarding the results in 2 weeks, please contact this office.

## 2016-09-14 NOTE — Progress Notes (Signed)
By signing my name below, I, Jerry Hart, attest that this documentation has been prepared under the direction and in the presence of Jerry Ray, MD.  Electronically Signed: Verlee Hart, Medical Scribe. 09/14/16. 4:06 PM.  Subjective:    Patient ID: Jerry Hart, male    DOB: 1971-03-15, 46 y.o.   MRN: [id number]  HPI Chief Complaint  Patient presents with  . Annual Exam    DOT PE    HPI Comments: Jerry Hart is a 46 y.o. 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 He has a PMHx of DM, he's on oral medication only. Denies insulin use. 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 He was given a 1 year card with most recent A1c 9.2 at that time. He was seen Aug 22 for mild OSA by [facility]. He had a AHI of 7 at the home study.  Medications: He takes glimepiride 4 mg QD, azor 5/40 mg QD, meloxicam 15 mg QD, and synjardy-xr 25/1000 mg QD. Denies experiencing negative side effects from his medications or acute sxs.  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  DM: He is followed by Jerry Loveless, FNP, (located at [place]) for his DM and his A1c last month was 7.8. Pt has a follow-up appt in 2 days. Denies hypoglycemic episodes.  Surgery: His hip replacement was in 2013 and he hasn't had problem since then. Denies heart problems, or blocked artery.   Mild OSA: Pt reports he was supposed to get a mask to sleep at night with but he never received a phone call. He admits to experiencing day time somnolence at his last DOT, but not currently. Denies snoring at night, daytime somnolence, caffeine consumption to maintain alertness.  Vision/Hearing: Does not wear glasses or contacts. Pt does not wear hearing aids.  Visual Acuity Screening   Right eye Left eye Both eyes  Without correction: 20/25 20/25 20/25   With correction:     Comments: Horizontal Vision: 85 degrees (OU) Colors: passed 8/8  Hearing Screening Comments:  Whisper test: Passed at 10 ft (AU)  Patient Active Problem List   Diagnosis Date Noted  . Arthritis, hip 03/28/2014   Past Medical History:  Diagnosis Date  . Arthritis    in his hip  . Chest pain 08/14/2012  . Diabetes mellitus without complication ([place])    TYPE 2  . DOE (dyspnea on exertion)    whenever he is working hard  . Gout    left fingers  . Hypertension    Past Surgical History:  Procedure Laterality Date  . CARDIAC CATHETERIZATION     2014  . HIP PINNING Right 1985  . LEFT HEART CATHETERIZATION WITH CORONARY ANGIOGRAM N/A 08/17/2012   Procedure: LEFT HEART CATHETERIZATION WITH CORONARY ANGIOGRAM;  Surgeon: Birdie Riddle, MD;  Location: [facility];  Service: Cardiovascular;  Laterality: N/A;  . TOTAL HIP ARTHROPLASTY Right 03/28/2014   Procedure: RIGHT TOTAL HIP ARTHROPLASTY;  Surgeon: Kerin Salen, MD;  Location: [place];  Service: Orthopedics;  Laterality: Right;   No Known Allergies Prior to Admission medications   Medication Sig Start Date End Date Taking? Authorizing Provider  amLODipine-olmesartan (AZOR) 5-40 MG per tablet Take 1 tablet by mouth daily. Reported on 09/01/2015   Yes [provider]  glimepiride (AMARYL) 4 MG tablet Take 4 mg by mouth daily with breakfast.   Yes [provider]  meloxicam (MOBIC) 15 MG  tablet Take 15 mg by mouth daily. Reported on 09/01/2015   Yes [provider]  SYNJARDY XR 25-1000 MG TB24 Take 1 tablet by mouth daily. 09/12/16  Yes [provider]  TRULICITY 1.5 DU/2.0UR SOPN once a week. 09/09/16  Yes [provider]  canagliflozin (INVOKANA) 100 MG TABS tablet Take by mouth daily before breakfast.    [provider]  linagliptin (TRADJENTA) 5 MG TABS tablet Take 5 mg by mouth daily. Reported on 09/01/2015    [provider]   Social History   Social History  . Marital status: Married    Spouse name: N/A  . Number of children: N/A  . Years of education: N/A    Occupational History  . Not on file.   Social History Main Topics  . Smoking status: Never Smoker  . Smokeless tobacco: Never Used  . Alcohol use No  . Drug use: No  . Sexual activity: Yes   Other Topics Concern  . Not on file   Social History Narrative  . No narrative on file   Review of Systems Objective:  Physical Exam  Constitutional: He is oriented to person, place, and time. He appears well-developed and well-nourished.  HENT:  Head: Normocephalic and atraumatic.  Right Ear: External ear normal.  Left Ear: External ear normal.  Mouth/Throat: Oropharynx is clear and moist.  Eyes: Conjunctivae and EOM are normal. Pupils are equal, round, and reactive to light.  Neck: Normal range of motion. Neck supple. No thyromegaly present.  Cardiovascular: Normal rate, regular rhythm, normal heart sounds and intact distal pulses.   Pulmonary/Chest: Effort normal and breath sounds normal. No respiratory distress. He has no wheezes.  Abdominal: Soft. He exhibits no distension. There is no tenderness. Hernia confirmed negative in the right inguinal area and confirmed negative in the left inguinal area.  Musculoskeletal: Normal range of motion. He exhibits no edema or tenderness.  Lymphadenopathy:    He has no cervical adenopathy.  Neurological: He is alert and oriented to person, place, and time. He has normal reflexes.  His Epworth sleepiness scale is 5  Skin: Skin is warm and dry.  Psychiatric: He has a normal mood and affect. His behavior is normal.  Vitals reviewed.   Vitals:   09/14/16 1533  BP: 136/88  Pulse: 87  Resp: 17  Temp: 98.4 F (36.9 C)  TempSrc: Oral  SpO2: 99%  Weight: (!) 306 lb 2 oz (138.9 kg)  Height: 6' 1.5" (1.867 m)   Body mass index is 39.84 kg/m. Assessment & Plan:   Jerry Hart is a 46 y.o. 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 See paperwork.   -History of diabetes with oral medications only, with  improved A1c by report. Glycosuria due to one of his medications.  - Proteinuria, hematuria is being followed by his primary care provider.   -On review of his chart, he has been diagnosed with obstructive sleep apnea but only mild based on AHI on home testing with reading of 7. Based on review of DOT recommendations, 3 months inservice evaluation was recommended to clarify if he does have sleep apnea and if treatment is needed.    Meds ordered this encounter  Medications  . SYNJARDY XR 25-1000 MG TB24    Sig: Take 1 tablet by mouth daily.  . TRULICITY 1.5 [place]/7.0WC SOPN    Sig: once a week.   Patient Instructions   Because of previous diagnosis of sleep apnea, you will  need to have further evaluation by the sleep specialist to determine if you do need to be on CPAP, or if your symptoms/testing is milder than the past not requiring further evaluation. As we discussed if your AHI is greater than 5, further evaluation was recommended. Because you are are not having any daytime sleepiness, you can continue to drive while you are getting that evaluation.  Let me know if there any questions in the meantime.     IF you received an x-Hart today, you will receive an invoice from [facility]. Please contact [facility] at [phone] with questions or concerns regarding your invoice.   IF you received labwork today, you will receive an invoice from [facility]. Please contact [facility] at [phone] with questions or concerns regarding your invoice.   Our billing staff will not be able to assist you with questions regarding bills from these companies.  You will be contacted with the lab results as soon as they are available. The fastest way to get your results is to activate your My Chart account. Instructions are located on the last page of this paperwork. If you have not heard from [place] regarding the results in 2 weeks, please contact this office.       I personally performed  the services described in this documentation, which was scribed in my presence. The recorded information has been reviewed and considered for accuracy and completeness, addended by me as needed, and agree with information above.  Signed,   Jerry Ray, MD Primary [facility] at [place].  09/15/16 5:37 PM

## 2016-12-06 ENCOUNTER — Emergency Department (HOSPITAL_COMMUNITY): Payer: No Typology Code available for payment source

## 2016-12-06 ENCOUNTER — Emergency Department (HOSPITAL_COMMUNITY)
Admission: EM | Admit: 2016-12-06 | Discharge: 2016-12-06 | Disposition: A | Discharge status: Home / Self Care | Payer: No Typology Code available for payment source | Attending: Emergency Medicine | Admitting: Emergency Medicine

## 2016-12-06 ENCOUNTER — Encounter (HOSPITAL_COMMUNITY): Payer: Self-pay

## 2016-12-06 DIAGNOSIS — S3992XA Unspecified injury of lower back, initial encounter: Secondary | ICD-10-CM | POA: Diagnosis present

## 2016-12-06 DIAGNOSIS — S80812A Abrasion, left lower leg, initial encounter: Secondary | ICD-10-CM | POA: Diagnosis not present

## 2016-12-06 DIAGNOSIS — Y999 Unspecified external cause status: Secondary | ICD-10-CM | POA: Insufficient documentation

## 2016-12-06 DIAGNOSIS — Y939 Activity, unspecified: Secondary | ICD-10-CM | POA: Diagnosis not present

## 2016-12-06 DIAGNOSIS — E119 Type 2 diabetes mellitus without complications: Secondary | ICD-10-CM | POA: Insufficient documentation

## 2016-12-06 DIAGNOSIS — S63501A Unspecified sprain of right wrist, initial encounter: Secondary | ICD-10-CM | POA: Diagnosis not present

## 2016-12-06 DIAGNOSIS — Z79899 Other long term (current) drug therapy: Secondary | ICD-10-CM | POA: Insufficient documentation

## 2016-12-06 DIAGNOSIS — Z7984 Long term (current) use of oral hypoglycemic drugs: Secondary | ICD-10-CM | POA: Insufficient documentation

## 2016-12-06 DIAGNOSIS — S161XXA Strain of muscle, fascia and tendon at neck level, initial encounter: Secondary | ICD-10-CM | POA: Diagnosis not present

## 2016-12-06 DIAGNOSIS — Y92410 Unspecified street and highway as the place of occurrence of the external cause: Secondary | ICD-10-CM | POA: Diagnosis not present

## 2016-12-06 DIAGNOSIS — S39012A Strain of muscle, fascia and tendon of lower back, initial encounter: Secondary | ICD-10-CM | POA: Diagnosis not present

## 2016-12-06 DIAGNOSIS — I10 Essential (primary) hypertension: Secondary | ICD-10-CM | POA: Diagnosis not present

## 2016-12-06 DIAGNOSIS — S50812A Abrasion of left forearm, initial encounter: Secondary | ICD-10-CM | POA: Diagnosis not present

## 2016-12-06 HISTORY — DX: Sleep apnea, unspecified: G47.30

## 2016-12-06 MED ORDER — HYDROMORPHONE HCL 1 MG/ML IJ SOLN
1.0000 mg | Freq: Once | INTRAMUSCULAR | Status: AC
  Administered 2016-12-06: 1 mg via INTRAVENOUS
  Filled 2016-12-06: qty 1

## 2016-12-06 MED ORDER — CYCLOBENZAPRINE HCL 10 MG PO TABS: 10.0000 mg | ORAL_TABLET | Freq: Three times a day (TID) | ORAL | 0 refills | Status: DC | PRN

## 2016-12-06 MED ORDER — NAPROXEN 500 MG PO TABS: 500.0000 mg | ORAL_TABLET | Freq: Two times a day (BID) | ORAL | 0 refills | Status: DC

## 2016-12-06 MED ORDER — KETOROLAC TROMETHAMINE 15 MG/ML IJ SOLN
15.0000 mg | Freq: Once | INTRAMUSCULAR | Status: AC
  Administered 2016-12-06: 15 mg via INTRAVENOUS
  Filled 2016-12-06: qty 1

## 2016-12-06 NOTE — ED Notes (Signed)
Pt taken to CT.

## 2016-12-06 NOTE — Progress Notes (Signed)
Chaplain greeted family in waiting room. Provided [facility], offered comfort and prayer. Chaplain escorted family back to Trauma bay to see pt.once stabilized.
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
## 2016-12-06 NOTE — ED Notes (Signed)
Pt able to ambulate without assistance.  

## 2016-12-06 NOTE — ED Notes (Signed)
Pt returned from X-ray.  

## 2016-12-06 NOTE — ED Notes (Signed)
Pt taken to Xray.

## 2016-12-09 ENCOUNTER — Encounter: Payer: Self-pay | Admitting: Physician Assistant

## 2017-12-31 DIAGNOSIS — E7800 Pure hypercholesterolemia, unspecified: Secondary | ICD-10-CM | POA: Diagnosis present

## 2017-12-31 DIAGNOSIS — E119 Type 2 diabetes mellitus without complications: Secondary | ICD-10-CM

## 2017-12-31 DIAGNOSIS — E1159 Type 2 diabetes mellitus with other circulatory complications: Secondary | ICD-10-CM

## 2019-10-10 ENCOUNTER — Encounter (HOSPITAL_COMMUNITY): Payer: Self-pay

## 2019-10-10 ENCOUNTER — Ambulatory Visit (HOSPITAL_COMMUNITY)
Admission: EM | Admit: 2019-10-10 | Discharge: 2019-10-10 | Disposition: A | Discharge status: Home / Self Care | Payer: BLUE CROSS/BLUE SHIELD

## 2019-10-10 ENCOUNTER — Other Ambulatory Visit: Payer: Self-pay

## 2019-10-10 DIAGNOSIS — H1031 Unspecified acute conjunctivitis, right eye: Secondary | ICD-10-CM

## 2019-10-10 MED ORDER — SULFAMETHOXAZOLE-TRIMETHOPRIM 800-160 MG PO TABS: 1.0000 | ORAL_TABLET | Freq: Two times a day (BID) | ORAL | 0 refills | Status: AC

## 2019-10-10 MED ORDER — TOBRAMYCIN 0.3 % OP OINT: 1.0000 "application " | TOPICAL_OINTMENT | Freq: Four times a day (QID) | OPHTHALMIC | 0 refills | Status: AC

## 2019-10-10 MED ORDER — FLUORESCEIN SODIUM 1 MG OP STRP
ORAL_STRIP | OPHTHALMIC | Status: AC
  Filled 2019-10-10: qty 1

## 2019-10-10 MED ORDER — TETRACAINE HCL 0.5 % OP SOLN
OPHTHALMIC | Status: AC
  Filled 2019-10-10: qty 4

## 2019-10-10 NOTE — ED Triage Notes (Signed)
Pt presents to UC for right eye redness, swelling, itching, and drainage. Pt states when he wakes up his eye is stuck together with discharge. Pt states symptoms have been present since Wednesday. Pt has been treating with OTC pink eye drops with out relief. Pt denies fever, chills, nausea, vomiting, body aches.

## 2019-10-10 NOTE — Discharge Instructions (Signed)
I have prescribed oral as well as topical antibiotics to help your eye.  Please follow up with your eye doctor for recheck in the next week.  Return if worsening

## 2019-10-10 NOTE — ED Provider Notes (Signed)
[facility]    CSN: [id number] Arrival date & time: 10/10/19  1527      History   Chief Complaint Chief Complaint  Patient presents with  . Facial Swelling    HPI Jerry Hart is a 49 y.o. male.   Jerry Hart presents with complaints of right eye redness, swelling, discharge. Noted it first on 7/7, it felt itching and began tearing. Has been mattering. Hasn't worsened but hasn't improved much. Not painful but feels irritated with blinking. Has tried OTC drops as well as oral benadryl which have not helped. No other URI symptoms. Left eye feels normal. Doesn't wear contacts or glasses. Does have an eye doctor. No pain with eye movement. No known exposure to chemicals or foreign bodies. No known ill contacts. No headache. Vision feels blurry but no loss of vision.   ROS per HPI, negative if not otherwise mentioned.      Past Medical History:  Diagnosis Date  . Arthritis    in his hip  . Chest pain 08/14/2012  . Diabetes mellitus without complication ([place])    TYPE 2  . Diabetes mellitus without complication ([place])   . DOE (dyspnea on exertion)    whenever he is working hard  . Gout    left fingers  . Hypertension   . Sleep apnea     Patient Active Problem List   Diagnosis Date Noted  . Arthritis, hip 03/28/2014    Past Surgical History:  Procedure Laterality Date  . CARDIAC CATHETERIZATION     2014  . HIP PINNING Right 1985  . LEFT HEART CATHETERIZATION WITH CORONARY ANGIOGRAM N/A 08/17/2012   Procedure: LEFT HEART CATHETERIZATION WITH CORONARY ANGIOGRAM;  Surgeon: Birdie Riddle, MD;  Location: [facility]ATH [facility];  Service: Cardiovascular;  Laterality: N/A;  . TOTAL HIP ARTHROPLASTY Right 03/28/2014   Procedure: RIGHT TOTAL HIP ARTHROPLASTY;  Surgeon: Kerin Salen, MD;  Location: [facility];  Service: Orthopedics;  Laterality: Right;  . TOTAL HIP ARTHROPLASTY     Right       Home Medications    Prior to Admission medications   Medication Sig Start  Date End Date Taking? Authorizing Provider  amlodipine-olmesartan (AZOR) 10-20 MG tablet Take by mouth.   Yes [provider]  canagliflozin (INVOKANA) 100 MG TABS tablet Take by mouth daily before breakfast.   Yes [provider]  Empagliflozin-Metformin HCl (SYNJARDY) 12.5-500 MG TABS Take 2 tablets by mouth every morning.    Yes [provider]  glimepiride (AMARYL) 4 MG tablet Take 4 mg by mouth daily with breakfast.   Yes [provider]  Semaglutide (OZEMPIC) 0.25 or 0.5 MG/DOSE SOPN Inject 0.5 mg into the skin every Tuesday.   Yes [provider]  amLODipine-olmesartan (AZOR) 5-40 MG per tablet Take 1 tablet by mouth daily. Reported on 09/01/2015    [provider]  amLODipine-olmesartan (AZOR) 5-40 MG tablet Take 1 tablet by mouth daily.    [provider]  canagliflozin (INVOKANA) 100 MG TABS tablet Take by mouth. Patient not taking: Reported on 10/10/2019    [provider]  cyclobenzaprine (FLEXERIL) 10 MG tablet Take 1 tablet (10 mg total) by mouth 3 (three) times daily as needed for muscle spasms. 12/06/16   Sherwood Gambler, MD  glimepiride (AMARYL) 4 MG tablet Take 4 mg by mouth daily with breakfast.     [provider]  GLIMEPIRIDE PO Take by mouth.    [provider]  linagliptin (  TRADJENTA) 5 MG TABS tablet Take 5 mg by mouth daily. Reported on 09/01/2015 Patient not taking: Reported on 10/10/2019    [provider]  meloxicam (MOBIC) 15 MG tablet Take 15 mg by mouth daily. Reported on 09/01/2015    [provider]  naproxen (NAPROSYN) 500 MG tablet Take 1 tablet (500 mg total) by mouth 2 (two) times daily with a meal. 12/06/16   Sherwood Gambler, MD  sulfamethoxazole-trimethoprim (BACTRIM DS) 800-160 MG tablet Take 1 tablet by mouth 2 (two) times daily for 5 days. 10/10/19 10/15/19  Zigmund Gottron, NP  SYNJARDY XR 25-1000 MG TB24 Take 1 tablet by mouth daily. 09/12/16   [provider]  tobramycin (TOBREX) 0.3 % ophthalmic ointment Place 1 application into the right eye 4 (four) times daily for 7 days. 10/10/19 10/17/19  Zigmund Gottron, NP  TRULICITY 1.5 WU/9.8JX SOPN once a week. 09/09/16   [provider]    Family History Family History  Problem Relation Age of Onset  . Cancer Mother   . Hypertension Mother     Social History Social History   Tobacco Use  . Smoking status: Never Smoker  Substance Use Topics  . Alcohol use: No  . Drug use: No     Allergies   Patient has no known allergies.   Review of Systems Review of Systems   Physical Exam Triage Vital Signs ED Triage Vitals [10/10/19 1554]  Enc Vitals Group     BP 135/72     Pulse Rate 88     Resp 16     Temp 98.2 F (36.8 C)     Temp Source Oral     SpO2 100 %     Weight      Height      Head Circumference      Peak Flow      Pain Score 0     Pain Loc      Pain Edu?      Excl. in [place]?    No data found.  Updated Vital Signs BP 135/72 (BP Location: Right Arm)   Pulse 88   Temp 98.2 F (36.8 C) (Oral)   Resp 16   SpO2 100%   Visual Acuity Right Eye Distance: 20/40 Left Eye Distance: 20/30 Bilateral Distance: 20/40  Right Eye Near:   Left Eye Near:    Bilateral Near:     Physical Exam Constitutional:      Appearance: He is well-developed.  Eyes:     General: Vision grossly intact.        Right eye: Discharge present.     Extraocular Movements: Extraocular movements intact.     Conjunctiva/sclera:     Right eye: Right conjunctiva is injected.     Pupils: Pupils are equal, round, and reactive to light.     Right eye: No corneal abrasion or fluorescein uptake.     Comments: Right eye grossly injected with white discharge and crusting noted to eye lid; upper and lower lid mildly swollen which doesn't extend to the face; non tender  Cardiovascular:     Rate and Rhythm: Normal rate.  Pulmonary:     Effort: Pulmonary effort is normal.  Skin:     General: Skin is warm and dry.  Neurological:     Mental Status: He is alert and oriented to person, place, and time.      UC Treatments / Results  Labs (all labs ordered are listed, but only  abnormal results are displayed) Labs Reviewed - No data to display  EKG   Radiology No results found.  Procedures Procedures (including critical care time)  Medications Ordered in UC Medications - No data to display  Initial Impression / Assessment and Plan / UC Course  I have reviewed the triage vital signs and the nursing notes.  Pertinent labs & imaging results that were available during my care of the patient were reviewed by me and considered in my medical decision making (see chart for details).     Conjunctivitis, causes lid swelling as well. Opted to provide oral and topical antibiotics. Follow up with ophthalmology next week. Return precautions provided. Patient verbalized understanding and agreeable to plan.   Final Clinical Impressions(s) / UC Diagnoses   Final diagnoses:  Acute bacterial conjunctivitis of right eye     Discharge Instructions     I have prescribed oral as well as topical antibiotics to help your eye.  Please follow up with your eye doctor for recheck in the next week.  Return if worsening    ED Prescriptions    Medication Sig Dispense Auth. Provider   sulfamethoxazole-trimethoprim (BACTRIM DS) 800-160 MG tablet Take 1 tablet by mouth 2 (two) times daily for 5 days. 10 tablet Augusto Gamble B, NP   tobramycin (TOBREX) 0.3 % ophthalmic ointment Place 1 application into the right eye 4 (four) times daily for 7 days. 3.5 g Zigmund Gottron, NP     PDMP not reviewed this encounter.   Zigmund Gottron, NP 10/10/19 1710

## 2020-10-17 ENCOUNTER — Inpatient Hospital Stay (HOSPITAL_BASED_OUTPATIENT_CLINIC_OR_DEPARTMENT_OTHER)
Admission: EM | Admit: 2020-10-17 | Discharge: 2020-10-19 | DRG: 390 | Disposition: A | Discharge status: Home / Self Care | Payer: BLUE CROSS/BLUE SHIELD | Attending: Internal Medicine | Admitting: Internal Medicine

## 2020-10-17 ENCOUNTER — Emergency Department (HOSPITAL_BASED_OUTPATIENT_CLINIC_OR_DEPARTMENT_OTHER): Payer: BLUE CROSS/BLUE SHIELD

## 2020-10-17 ENCOUNTER — Inpatient Hospital Stay (HOSPITAL_COMMUNITY): Payer: BLUE CROSS/BLUE SHIELD

## 2020-10-17 ENCOUNTER — Encounter (HOSPITAL_BASED_OUTPATIENT_CLINIC_OR_DEPARTMENT_OTHER): Payer: Self-pay | Admitting: *Deleted

## 2020-10-17 ENCOUNTER — Other Ambulatory Visit: Payer: Self-pay

## 2020-10-17 DIAGNOSIS — Z6837 Body mass index (BMI) 37.0-37.9, adult: Secondary | ICD-10-CM | POA: Diagnosis not present

## 2020-10-17 DIAGNOSIS — M109 Gout, unspecified: Secondary | ICD-10-CM | POA: Diagnosis present

## 2020-10-17 DIAGNOSIS — Z791 Long term (current) use of non-steroidal anti-inflammatories (NSAID): Secondary | ICD-10-CM | POA: Diagnosis not present

## 2020-10-17 DIAGNOSIS — K5651 Intestinal adhesions [bands], with partial obstruction: Secondary | ICD-10-CM | POA: Diagnosis present

## 2020-10-17 DIAGNOSIS — Z8249 Family history of ischemic heart disease and other diseases of the circulatory system: Secondary | ICD-10-CM

## 2020-10-17 DIAGNOSIS — M1990 Unspecified osteoarthritis, unspecified site: Secondary | ICD-10-CM | POA: Diagnosis present

## 2020-10-17 DIAGNOSIS — R1011 Right upper quadrant pain: Secondary | ICD-10-CM

## 2020-10-17 DIAGNOSIS — G4726 Circadian rhythm sleep disorder, shift work type: Secondary | ICD-10-CM | POA: Diagnosis present

## 2020-10-17 DIAGNOSIS — E1159 Type 2 diabetes mellitus with other circulatory complications: Secondary | ICD-10-CM

## 2020-10-17 DIAGNOSIS — I152 Hypertension secondary to endocrine disorders: Secondary | ICD-10-CM | POA: Diagnosis present

## 2020-10-17 DIAGNOSIS — Z0189 Encounter for other specified special examinations: Secondary | ICD-10-CM

## 2020-10-17 DIAGNOSIS — I2510 Atherosclerotic heart disease of native coronary artery without angina pectoris: Secondary | ICD-10-CM | POA: Diagnosis present

## 2020-10-17 DIAGNOSIS — Z79899 Other long term (current) drug therapy: Secondary | ICD-10-CM

## 2020-10-17 DIAGNOSIS — I445 Left posterior fascicular block: Secondary | ICD-10-CM | POA: Diagnosis present

## 2020-10-17 DIAGNOSIS — I10 Essential (primary) hypertension: Secondary | ICD-10-CM | POA: Diagnosis present

## 2020-10-17 DIAGNOSIS — E7800 Pure hypercholesterolemia, unspecified: Secondary | ICD-10-CM | POA: Diagnosis present

## 2020-10-17 DIAGNOSIS — E11638 Type 2 diabetes mellitus with other oral complications: Secondary | ICD-10-CM | POA: Diagnosis not present

## 2020-10-17 DIAGNOSIS — E119 Type 2 diabetes mellitus without complications: Secondary | ICD-10-CM | POA: Diagnosis present

## 2020-10-17 DIAGNOSIS — Z20822 Contact with and (suspected) exposure to covid-19: Secondary | ICD-10-CM | POA: Diagnosis present

## 2020-10-17 DIAGNOSIS — K567 Ileus, unspecified: Secondary | ICD-10-CM

## 2020-10-17 DIAGNOSIS — K56609 Unspecified intestinal obstruction, unspecified as to partial versus complete obstruction: Secondary | ICD-10-CM | POA: Diagnosis present

## 2020-10-17 DIAGNOSIS — I440 Atrioventricular block, first degree: Secondary | ICD-10-CM | POA: Diagnosis present

## 2020-10-17 DIAGNOSIS — I252 Old myocardial infarction: Secondary | ICD-10-CM | POA: Diagnosis not present

## 2020-10-17 DIAGNOSIS — Z96641 Presence of right artificial hip joint: Secondary | ICD-10-CM | POA: Diagnosis present

## 2020-10-17 DIAGNOSIS — G4733 Obstructive sleep apnea (adult) (pediatric): Secondary | ICD-10-CM | POA: Diagnosis present

## 2020-10-17 LAB — COMPREHENSIVE METABOLIC PANEL
ALT: 24 U/L (ref 0–44)
AST: 19 U/L (ref 15–41)
Albumin: 4.3 g/dL (ref 3.5–5.0)
Alkaline Phosphatase: 89 U/L (ref 38–126)
Anion gap: 11 (ref 5–15)
BUN: 13 mg/dL (ref 6–20)
CO2: 23 mmol/L (ref 22–32)
Calcium: 9.6 mg/dL (ref 8.9–10.3)
Chloride: 102 mmol/L (ref 98–111)
Creatinine, Ser: 0.73 mg/dL (ref 0.61–1.24)
GFR, Estimated: >60 mL/min (ref >60)
Glucose, Bld: 113 mg/dL — ABNORMAL HIGH (ref 70–99)
Potassium: 4.2 mmol/L (ref 3.5–5.1)
Sodium: 136 mmol/L (ref 135–145)
Total Bilirubin: 0.9 mg/dL (ref 0.3–1.2)
Total Protein: 8.2 g/dL — ABNORMAL HIGH (ref 6.5–8.1)

## 2020-10-17 LAB — CBC
HCT: 53.8 % — ABNORMAL HIGH (ref 39.0–52.0)
HCT: 55.9 % — ABNORMAL HIGH (ref 39.0–52.0)
Hemoglobin: 16.9 g/dL (ref 13.0–17.0)
Hemoglobin: 16.9 g/dL (ref 13.0–17.0)
MCH: 24.5 pg — ABNORMAL LOW (ref 26.0–34.0)
MCH: 24.2 pg — ABNORMAL LOW (ref 26.0–34.0)
MCHC: 31.4 g/dL (ref 30.0–36.0)
MCHC: 30.2 g/dL (ref 30.0–36.0)
MCV: 78 fL — ABNORMAL LOW (ref 80.0–100.0)
MCV: 80.1 fL (ref 80.0–100.0)
Platelets: 268 10*3/uL (ref 150–400)
Platelets: 304 10*3/uL (ref 150–400)
RBC: 6.9 MIL/uL — ABNORMAL HIGH (ref 4.22–5.81)
RBC: 6.98 MIL/uL — ABNORMAL HIGH (ref 4.22–5.81)
RDW: 18.5 % — ABNORMAL HIGH (ref 11.5–15.5)
RDW: 18.9 % — ABNORMAL HIGH (ref 11.5–15.5)
WBC: 6.7 10*3/uL (ref 4.0–10.5)
WBC: 6.2 10*3/uL (ref 4.0–10.5)
nRBC: 0 % (ref 0.0–0.2)
nRBC: 0 % (ref 0.0–0.2)

## 2020-10-17 LAB — CREATININE, SERUM
Creatinine, Ser: 0.84 mg/dL (ref 0.61–1.24)
GFR, Estimated: >60 mL/min (ref >60)

## 2020-10-17 LAB — RESP PANEL BY RT-PCR (FLU A&B, COVID) ARPGX2
Influenza A by PCR: NEGATIVE
Influenza B by PCR: NEGATIVE
SARS Coronavirus 2 by RT PCR: NEGATIVE

## 2020-10-17 LAB — LIPASE, BLOOD: Lipase: 15 U/L (ref 11–51)

## 2020-10-17 LAB — GLUCOSE, CAPILLARY: Glucose-Capillary: 97 mg/dL (ref 70–99)

## 2020-10-17 MED ORDER — IOHEXOL 350 MG/ML SOLN
100.0000 mL | Freq: Once | INTRAVENOUS | Status: AC | PRN
  Administered 2020-10-17: 100 mL via INTRAVENOUS

## 2020-10-17 MED ORDER — ONDANSETRON HCL 4 MG PO TABS: 4.0000 mg | ORAL_TABLET | Freq: Four times a day (QID) | ORAL | Status: DC | PRN

## 2020-10-17 MED ORDER — ONDANSETRON HCL 4 MG/2ML IJ SOLN: 4.0000 mg | Freq: Four times a day (QID) | INTRAMUSCULAR | Status: DC | PRN

## 2020-10-17 MED ORDER — DEXTROSE IN LACTATED RINGERS 5 % IV SOLN: INTRAVENOUS | Status: DC

## 2020-10-17 MED ORDER — LIDOCAINE VISCOUS HCL 2 % MT SOLN
15.0000 mL | Freq: Once | OROMUCOSAL | Status: AC
  Administered 2020-10-17: 15 mL via ORAL
  Filled 2020-10-17: qty 15

## 2020-10-17 MED ORDER — INSULIN ASPART 100 UNIT/ML IJ SOLN: 0.0000 [IU] | Freq: Every day | INTRAMUSCULAR | Status: DC

## 2020-10-17 MED ORDER — MORPHINE SULFATE (PF) 2 MG/ML IV SOLN
2.0000 mg | INTRAVENOUS | Status: DC | PRN
  Administered 2020-10-17: 2 mg via INTRAVENOUS
  Filled 2020-10-17: qty 1

## 2020-10-17 MED ORDER — LORAZEPAM 2 MG/ML IJ SOLN
1.0000 mg | Freq: Once | INTRAMUSCULAR | Status: AC
  Administered 2020-10-17: 1 mg via INTRAVENOUS
  Filled 2020-10-17: qty 1

## 2020-10-17 MED ORDER — ALUM & MAG HYDROXIDE-SIMETH 200-200-20 MG/5ML PO SUSP
30.0000 mL | Freq: Once | ORAL | Status: AC
  Administered 2020-10-17: 30 mL via ORAL
  Filled 2020-10-17: qty 30

## 2020-10-17 MED ORDER — ENOXAPARIN SODIUM 60 MG/0.6ML IJ SOSY
60.0000 mg | PREFILLED_SYRINGE | INTRAMUSCULAR | Status: DC
  Administered 2020-10-17 – 2020-10-18 (×2): 60 mg via SUBCUTANEOUS
  Filled 2020-10-17 (×2): qty 0.6

## 2020-10-17 MED ORDER — METOPROLOL TARTRATE 5 MG/5ML IV SOLN: 5.0000 mg | Freq: Four times a day (QID) | INTRAVENOUS | Status: DC | PRN

## 2020-10-17 MED ORDER — INSULIN ASPART 100 UNIT/ML IJ SOLN: 0.0000 [IU] | Freq: Three times a day (TID) | INTRAMUSCULAR | Status: DC

## 2020-10-17 MED ORDER — IOHEXOL 9 MG/ML PO SOLN
500.0000 mL | Freq: Once | ORAL | Status: AC
  Administered 2020-10-17: 500 mL via ORAL

## 2020-10-17 MED ORDER — DIATRIZOATE MEGLUMINE & SODIUM 66-10 % PO SOLN
90.0000 mL | Freq: Once | ORAL | Status: AC
  Administered 2020-10-17: 90 mL via NASOGASTRIC
  Filled 2020-10-17: qty 90

## 2020-10-17 NOTE — H&P (Signed)
History and Physical   Jerry Hart SHF:[id number] DOB: October 15, 1970 DOA: 10/17/2020  Referring MD/NP/PA: Dr. Tamera Punt  PCP: System, Provider Not In   Outpatient Specialists: None  Patient coming from: [facility] emergency room  Chief Complaint: Abdominal pain and diarrhea  HPI: Jerry Hart is a 50 y.o. male with medical history significant of diabetes, hypertension, gout, obstructive sleep apnea, morbid obesity, osteoarthritis who went to the ER today complaining of abdominal pain distention some diarrhea and the nausea vomiting.  Patient was seen and evaluated.  He was found to have partial small bowel obstruction.  Patient is passing gas but no bowel movement for the last 2 days.  Has no prior surgeries.  No known history of abdominal wall hernias.  Has not had any recent electrolyte abnormalities and no history of ileus.  Patient was discussed with general surgery who recommended admission with NG tube insertion.  He is therefore being admitted to the medical service with surgical consultation..  ED Course: Temperature 99.1 blood pressure 155/99 pulse 100 respiratory 20 oxygen sat 95% room air.  Chemistry is largely uneventful influenza and COVID screen negative.  Chemistry also largely negative.  CT abdomen pelvis showed partial small bowel obstruction with transition point in right abdomen suspicious for adhesions.  Also right nephrolithiasis.  Patient is being admitted to the hospital for further evaluation and treatment.  Review of Systems: As per HPI otherwise 10 point review of systems negative.    Past Medical History:  Diagnosis Date   Arthritis    in his hip   Chest pain 08/14/2012   Diabetes mellitus without complication ([place])    TYPE 2   Diabetes mellitus without complication ([place])    DOE (dyspnea on exertion)    whenever he is working hard   Gout    left fingers   Hypertension    Sleep apnea     Past Surgical History:  Procedure Laterality Date   CARDIAC  CATHETERIZATION     2014   HIP PINNING Right 1985   LEFT HEART CATHETERIZATION WITH CORONARY ANGIOGRAM N/A 08/17/2012   Procedure: LEFT HEART CATHETERIZATION WITH CORONARY ANGIOGRAM;  Surgeon: Birdie Riddle, MD;  Location: [facility];  Service: Cardiovascular;  Laterality: N/A;   TOTAL HIP ARTHROPLASTY Right 03/28/2014   Procedure: RIGHT TOTAL HIP ARTHROPLASTY;  Surgeon: Kerin Salen, MD;  Location: [facility];  Service: Orthopedics;  Laterality: Right;   TOTAL HIP ARTHROPLASTY     Right     reports that he has never smoked. He has never used smokeless tobacco. He reports that he does not drink alcohol and does not use drugs.  Not on File  Family History  Problem Relation Age of Onset   Cancer Mother    Hypertension Mother      Prior to Admission medications   Medication Sig Start Date End Date Taking? Authorizing Provider  amLODipine-olmesartan (AZOR) 5-40 MG tablet Take 1 tablet by mouth daily. Reported on 09/01/2015   Yes [provider]  Empagliflozin-metFORMIN HCl 12.5-500 MG TABS Take 2 tablets by mouth every morning.    Yes [provider]  glimepiride (AMARYL) 4 MG tablet Take 4 mg by mouth daily with breakfast.   Yes [provider]  meloxicam (MOBIC) 15 MG tablet Take 15 mg by mouth daily. Reported on 09/01/2015   Yes [provider]  Semaglutide,0.25 or 0.5MG /DOS, 2 MG/1.5ML SOPN Inject 0.5 mg into the skin every Tuesday.   Yes [provider]  cyclobenzaprine (  FLEXERIL) 10 MG tablet Take 1 tablet (10 mg total) by mouth 3 (three) times daily as needed for muscle spasms. Patient not taking: Reported on 10/17/2020 12/06/16   Sherwood Gambler, MD  naproxen (NAPROSYN) 500 MG tablet Take 1 tablet (500 mg total) by mouth 2 (two) times daily with a meal. Patient not taking: Reported on 10/17/2020 12/06/16   Sherwood Gambler, MD    Physical Exam: Vitals:   10/17/20 1600 10/17/20 1630 10/17/20 1700 10/17/20 1805  BP: (!) 151/90 129/88 (!) 145/89  (!) 132/94  Pulse: 83 94 96 99  Resp:  19 19 20   Temp:    98.3 F (36.8 C)  TempSrc:    Oral  SpO2: 99% 95% 96% 99%  Weight:    (!) 136.7 kg  Height:    6\' 3"  (1.905 m)      Constitutional: Acutely ill looking in mild distress Vitals:   10/17/20 1600 10/17/20 1630 10/17/20 1700 10/17/20 1805  BP: (!) 151/90 129/88 (!) 145/89 (!) 132/94  Pulse: 83 94 96 99  Resp:  19 19 20   Temp:    98.3 F (36.8 C)  TempSrc:    Oral  SpO2: 99% 95% 96% 99%  Weight:    (!) 136.7 kg  Height:    6\' 3"  (1.905 m)   Eyes: PERRL, lids and conjunctivae normal ENMT: Mucous membranes are dry posterior pharynx clear of any exudate or lesions.Normal dentition.  Neck: normal, supple, no masses, no thyromegaly Respiratory: clear to auscultation bilaterally, no wheezing, no crackles. Normal respiratory effort. No accessory muscle use.  Cardiovascular: Sinus tachycardia, no murmurs / rubs / gallops. No extremity edema. 2+ pedal pulses. No carotid bruits.  Abdomen: Diffusely distended, diffusely tender with positive bowel sounds, no masses palpated. No hepatosplenomegaly. Bowel sounds positive.  Musculoskeletal: no clubbing / cyanosis. No joint deformity upper and lower extremities. Good ROM, no contractures. Normal muscle tone.  Skin: no rashes, lesions, ulcers. No induration Neurologic: CN 2-12 grossly intact. Sensation intact, DTR normal. Strength 5/5 in all 4.  Psychiatric: Normal judgment and insight. Alert and oriented x 3. Normal mood.     Labs on Admission: I have personally reviewed following labs and imaging studies  CBC: Recent Labs  Lab 10/17/20 1019  WBC 6.7  HGB 16.9  HCT 53.8*  MCV 78.0*  PLT 009   Basic Metabolic Panel: Recent Labs  Lab 10/17/20 1019  NA 136  K 4.2  CL 102  CO2 23  GLUCOSE 113*  BUN 13  CREATININE 0.73  CALCIUM 9.6   GFR: Estimated Creatinine Clearance: 168.3 mL/min (by C-G formula based on SCr of 0.73 mg/dL). Liver Function Tests: Recent Labs  Lab  10/17/20 1019  AST 19  ALT 24  ALKPHOS 89  BILITOT 0.9  PROT 8.2*  ALBUMIN 4.3   Recent Labs  Lab 10/17/20 1019  LIPASE 15   No results for input(s): AMMONIA in the last 168 hours. Coagulation Profile: No results for input(s): INR, PROTIME in the last 168 hours. Cardiac Enzymes: No results for input(s): CKTOTAL, CKMB, CKMBINDEX, TROPONINI in the last 168 hours. BNP (last 3 results) No results for input(s): PROBNP in the last 8760 hours. HbA1C: No results for input(s): HGBA1C in the last 72 hours. CBG: No results for input(s): GLUCAP in the last 168 hours. Lipid Profile: No results for input(s): CHOL, HDL, LDLCALC, TRIG, CHOLHDL, LDLDIRECT in the last 72 hours. Thyroid Function Tests: No results for input(s): TSH, T4TOTAL, FREET4, T3FREE, THYROIDAB in the last  72 hours. Anemia Panel: No results for input(s): VITAMINB12, FOLATE, FERRITIN, TIBC, IRON, RETICCTPCT in the last 72 hours. Urine analysis: No results found for: COLORURINE, APPEARANCEUR, LABSPEC, PHURINE, GLUCOSEU, HGBUR, BILIRUBINUR, KETONESUR, PROTEINUR, UROBILINOGEN, NITRITE, LEUKOCYTESUR Sepsis Labs: @LABRCNTIP (procalcitonin:4,lacticidven:4) ) Recent Results (from the past 240 hour(s))  Resp Panel by RT-PCR (Flu A&B, Covid) Nasopharyngeal Swab     Status: None   Collection Time: 10/17/20  4:01 PM   Specimen: Nasopharyngeal Swab; Nasopharyngeal(NP) swabs in vial transport medium  Result Value Ref Range Status   SARS Coronavirus 2 by RT PCR NEGATIVE NEGATIVE Final    Comment: (NOTE) SARS-CoV-2 target nucleic acids are NOT DETECTED.  The SARS-CoV-2 RNA is generally detectable in upper respiratory specimens during the acute phase of infection. The lowest concentration of SARS-CoV-2 viral copies this assay can detect is 138 copies/mL. A negative result does not preclude SARS-Cov-2 infection and should not be used as the sole basis for treatment or other patient management decisions. A negative result may occur  with  improper specimen collection/handling, submission of specimen other than nasopharyngeal swab, presence of viral mutation(s) within the areas targeted by this assay, and inadequate number of viral copies(<138 copies/mL). A negative result must be combined with clinical observations, patient history, and epidemiological information. The expected result is Negative.  Fact Sheet for Patients:  EntrepreneurPulse.com.au  Fact Sheet for Healthcare Providers:  IncredibleEmployment.be  This test is no t yet approved or cleared by the [place] FDA and  has been authorized for detection and/or diagnosis of SARS-CoV-2 by FDA under an Emergency Use Authorization (EUA). This EUA will remain  in effect (meaning this test can be used) for the duration of the COVID-19 declaration under Section 564(b)(1) of the Act, 21 U.S.C.section 360bbb-3(b)(1), unless the authorization is terminated  or revoked sooner.       Influenza A by PCR NEGATIVE NEGATIVE Final   Influenza B by PCR NEGATIVE NEGATIVE Final    Comment: (NOTE) The Xpert Xpress SARS-CoV-2/FLU/RSV plus assay is intended as an aid in the diagnosis of influenza from Nasopharyngeal swab specimens and should not be used as a sole basis for treatment. Nasal washings and aspirates are unacceptable for Xpert Xpress SARS-CoV-2/FLU/RSV testing.  Fact Sheet for Patients: EntrepreneurPulse.com.au  Fact Sheet for Healthcare Providers: IncredibleEmployment.be  This test is not yet approved or cleared by the [place] FDA and has been authorized for detection and/or diagnosis of SARS-CoV-2 by FDA under an Emergency Use Authorization (EUA). This EUA will remain in effect (meaning this test can be used) for the duration of the COVID-19 declaration under Section 564(b)(1) of the Act, 21 U.S.C. section 360bbb-3(b)(1), unless the authorization is terminated  or revoked.  Performed at [facility], [address] [place], [place] [address]      Radiological Exams on Admission: CT ABDOMEN PELVIS W CONTRAST  Result Date: 10/17/2020 CLINICAL DATA:  Acute abdominal pain.  Diarrhea. EXAM: CT ABDOMEN AND PELVIS WITH CONTRAST TECHNIQUE: Multidetector CT imaging of the abdomen and pelvis was performed using the standard protocol following bolus administration of intravenous contrast. CONTRAST:  153mL OMNIPAQUE IOHEXOL 350 MG/ML SOLN COMPARISON:  None. FINDINGS: Lower Chest: No acute findings. Hepatobiliary: No hepatic masses identified. Gallbladder is unremarkable. No evidence of biliary ductal dilatation. Pancreas:  No mass or inflammatory changes. Spleen: Within normal limits in size and appearance. Adrenals/Urinary Tract: No masses identified. A 6 mm calculus is seen in the interpolar region of the right kidney. No evidence of ureteral calculi or hydronephrosis. Unremarkable  unopacified urinary bladder. Stomach/Bowel: Moderately dilated small bowel loops are seen in the abdomen and. A transition point is seen in the right abdomen, suspicious for adhesion. No soft tissue mass or inflammatory process identified. No abnormal fluid collections are seen. Vascular/Lymphatic: No pathologically enlarged lymph nodes. No acute vascular findings. Reproductive:  No mass or other significant abnormality. Other:  None. Musculoskeletal: No suspicious bone lesions identified. Right hip prosthesis again noted. IMPRESSION: Partial small bowel obstruction, with transition point in right abdomen, suspicious for adhesion. Right nephrolithiasis. No evidence of ureteral calculi or hydronephrosis. Electronically Signed   By: Marlaine Hind M.D.   On: 10/17/2020 15:11   DG Abd Portable 1V-Small Bowel Obstruction Protocol-initial, 8 hr delay  Result Date: 10/17/2020 CLINICAL DATA:  NG tube placement.  Small-bowel obstruction. EXAM: PORTABLE ABDOMEN - 1 VIEW  COMPARISON:  CT earlier today. FINDINGS: Tip of the enteric tube is below the diaphragm in the stomach, the side port is in the region of the gastroesophageal junction. Recommend advancement of at least 5 cm for optimal placement. Gaseous gastric distension is well as prominent small bowel corresponding to obstruction on prior CT. Right nephrolithiasis on CT not well seen on the current exam. IMPRESSION: 1. Tip of the enteric tube below the diaphragm in the stomach, side-port in the region of the gastroesophageal junction. Recommend advancement of at least 5 cm for optimal placement. 2. Small bowel obstruction as seen on CT. Electronically Signed   By: Keith Rake M.D.   On: 10/17/2020 16:30   US Abdomen Limited RUQ (LIVER/GB)  Result Date: 10/17/2020 CLINICAL DATA:  Right upper quadrant abdominal pain for 2 days. EXAM: ULTRASOUND ABDOMEN LIMITED RIGHT UPPER QUADRANT COMPARISON:  None. FINDINGS: Gallbladder: No gallstones or wall thickening visualized. No sonographic Murphy sign noted by sonographer. Common bile duct: Diameter: 4.2 mm Liver: There is diffuse increased echogenicity of the liver and decreased through transmission consistent with fatty infiltration. No focal lesions or biliary dilatation. Portal vein is patent on color Doppler imaging with normal direction of blood flow towards the liver. Other: None. IMPRESSION: 1. Normal gallbladder and normal caliber common bile duct. 2. Fatty infiltration of the liver but no hepatic lesions. Electronically Signed   By: Marijo Sanes M.D.   On: 10/17/2020 13:07    EKG: Independently reviewed.   Assessment/Plan Principal Problem:   SBO (small bowel obstruction) (HCC) Active Problems:   Diabetes mellitus ([place])   Hypercholesterolemia   Morbid obesity due to excess calories ([place])   Shift work sleep disorder   Essential hypertension     #1 partial small bowel obstruction: Patient is admitted.  NG tube inserted with more than 2 L of fluid already  evacuated.  We will attach it to low suction.  IV fluids, pain control and nausea control.  Surgery to follow.  #2 essential hypertension: Patient will be n.p.o. with NG tube in place.  We will use IV beta-blockers as needed.  #3 diabetes: Non-insulin-dependent.  Sliding scale insulin while NPO.  #4 morbid obesity: Stable at baseline.  #5 history of sleep apnea: We will offer CPAP at night.  #6 hyperlipidemia: Patient n.p.o. so holding statin   DVT prophylaxis: Lovenox Code Status: Full code Family Communication: Wife at bedside Disposition Plan: Home Consults called: General surgery Barkley Boards, PA Admission status: Inpatient  Severity of Illness: The appropriate patient status for this patient is INPATIENT. Inpatient status is judged to be reasonable and necessary in order to provide the required intensity of service to ensure  the patient's safety. The patient's presenting symptoms, physical exam findings, and initial radiographic and laboratory data in the context of their chronic comorbidities is felt to place them at high risk for further clinical deterioration. Furthermore, it is not anticipated that the patient will be medically stable for discharge from the hospital within 2 midnights of admission. The following factors support the patient status of inpatient.   " The patient's presenting symptoms include abdominal pain nausea vomiting. " The worrisome physical exam findings include abdominal distention with tenderness. " The initial radiographic and laboratory data are worrisome because of partial small bowel obstruction on CT. " The chronic co-morbidities include diabetes with hypertension.   * I certify that at the point of admission it is my clinical judgment that the patient will require inpatient hospital care spanning beyond 2 midnights from the point of admission due to high intensity of service, high risk for further deterioration and high frequency of surveillance  required.Barbette Merino MD [facility] Pager [phone]  If 7PM-7AM, please contact night-coverage [email or web address] Password [facility]  10/17/2020, 7:15 PM

## 2020-10-17 NOTE — ED Notes (Signed)
NG Tube advanced 5cm per Xray recommendation

## 2020-10-17 NOTE — Progress Notes (Signed)
Patient admitted from [facility] with partial small bowel obstruction.  He came in with nausea vomiting abdominal pain and abdominal distention and CT scan concerning for partial SBO.  Discussed with the ED physician.  She is putting in a call to general surgery.  I will admit the patient to inpatient medical service.

## 2020-10-17 NOTE — ED Triage Notes (Signed)
Pt c/o abdominal "burning".  Denies nausea and vomiting, but does c/o diarrhea.

## 2020-10-17 NOTE — ED Notes (Signed)
RT Note: 12 lead EKG obtained per order.

## 2020-10-17 NOTE — ED Notes (Signed)
Patient aware urine sample needed. Patient reports he will let us know when he can pee

## 2020-10-18 ENCOUNTER — Inpatient Hospital Stay (HOSPITAL_COMMUNITY): Payer: BLUE CROSS/BLUE SHIELD

## 2020-10-18 DIAGNOSIS — E11638 Type 2 diabetes mellitus with other oral complications: Secondary | ICD-10-CM

## 2020-10-18 DIAGNOSIS — I10 Essential (primary) hypertension: Secondary | ICD-10-CM

## 2020-10-18 DIAGNOSIS — E7800 Pure hypercholesterolemia, unspecified: Secondary | ICD-10-CM

## 2020-10-18 LAB — COMPREHENSIVE METABOLIC PANEL
ALT: 22 U/L (ref 0–44)
AST: 15 U/L (ref 15–41)
Albumin: 3.7 g/dL (ref 3.5–5.0)
Alkaline Phosphatase: 73 U/L (ref 38–126)
Anion gap: 13 (ref 5–15)
BUN: 16 mg/dL (ref 6–20)
CO2: 24 mmol/L (ref 22–32)
Calcium: 9.1 mg/dL (ref 8.9–10.3)
Chloride: 102 mmol/L (ref 98–111)
Creatinine, Ser: 0.78 mg/dL (ref 0.61–1.24)
GFR, Estimated: >60 mL/min (ref >60)
Glucose, Bld: 144 mg/dL — ABNORMAL HIGH (ref 70–99)
Potassium: 3.9 mmol/L (ref 3.5–5.1)
Sodium: 139 mmol/L (ref 135–145)
Total Bilirubin: 1 mg/dL (ref 0.3–1.2)
Total Protein: 7.3 g/dL (ref 6.5–8.1)

## 2020-10-18 LAB — CBC
HCT: 54.2 % — ABNORMAL HIGH (ref 39.0–52.0)
Hemoglobin: 16.5 g/dL (ref 13.0–17.0)
MCH: 24.4 pg — ABNORMAL LOW (ref 26.0–34.0)
MCHC: 30.4 g/dL (ref 30.0–36.0)
MCV: 80.2 fL (ref 80.0–100.0)
Platelets: 267 10*3/uL (ref 150–400)
RBC: 6.76 MIL/uL — ABNORMAL HIGH (ref 4.22–5.81)
RDW: 18.5 % — ABNORMAL HIGH (ref 11.5–15.5)
WBC: 4.7 10*3/uL (ref 4.0–10.5)
nRBC: 0 % (ref 0.0–0.2)

## 2020-10-18 LAB — HEMOGLOBIN A1C
Hgb A1c MFr Bld: 6.9 % — ABNORMAL HIGH (ref 4.8–5.6)
Mean Plasma Glucose: 151.33 mg/dL

## 2020-10-18 LAB — HIV ANTIBODY (ROUTINE TESTING W REFLEX): HIV Screen 4th Generation wRfx: NONREACTIVE

## 2020-10-18 LAB — GLUCOSE, CAPILLARY
Glucose-Capillary: 135 mg/dL — ABNORMAL HIGH (ref 70–99)
Glucose-Capillary: 112 mg/dL — ABNORMAL HIGH (ref 70–99)

## 2020-10-18 MED ORDER — PHENOL 1.4 % MT LIQD
1.0000 | OROMUCOSAL | Status: DC | PRN
  Administered 2020-10-18: 1 via OROMUCOSAL
  Filled 2020-10-18: qty 177

## 2020-10-18 MED ORDER — FAMOTIDINE IN NACL 20-0.9 MG/50ML-% IV SOLN
20.0000 mg | Freq: Two times a day (BID) | INTRAVENOUS | Status: DC
  Administered 2020-10-18 – 2020-10-19 (×3): 20 mg via INTRAVENOUS
  Filled 2020-10-18 (×3): qty 50

## 2020-10-18 MED ORDER — LACTATED RINGERS IV SOLN: INTRAVENOUS | Status: DC

## 2020-10-18 NOTE — ED Provider Notes (Signed)
[facility] Provider Note   CSN: [id number] Arrival date & time: 10/17/20  1004     History Chief Complaint  Patient presents with   Abdominal Pain    Jerry Hart is a 50 y.o. male with past medical history of diabetes and hypertension presenting to the ED with abdominal pain. Pain started 07/18 and is getting worse. Patient has diarrhea which started before the abdominal pain. Patient stated he feels his belly is on fire. He is denying any other symptoms such as nausea and vomiting. Patient did endorse chills overnight but did not check his temperature. He endorsed a nightly cough that occurs few times a month. He denied smoking, drinking, and illicit substance use. He is a Administrator.        Past Medical History:  Diagnosis Date   Arthritis    in his hip   Chest pain 08/14/2012   Diabetes mellitus without complication ([place])    TYPE 2   Diabetes mellitus without complication ([place])    DOE (dyspnea on exertion)    whenever he is working hard   Gout    left fingers   Hypertension    Sleep apnea     Patient Active Problem List   Diagnosis Date Noted   SBO (small bowel obstruction) (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   Arthritis, hip 03/28/2014    Past Surgical History:  Procedure Laterality Date   CARDIAC CATHETERIZATION     2014   HIP PINNING Right 1985   LEFT HEART CATHETERIZATION WITH CORONARY ANGIOGRAM N/A 08/17/2012   Procedure: LEFT HEART CATHETERIZATION WITH CORONARY ANGIOGRAM;  Surgeon: Birdie Riddle, MD;  Location: [facility];  Service: Cardiovascular;  Laterality: N/A;   TOTAL HIP ARTHROPLASTY Right 03/28/2014   Procedure: RIGHT TOTAL HIP ARTHROPLASTY;  Surgeon: Kerin Salen, MD;  Location: [facility];  Service: Orthopedics;   Laterality: Right;   TOTAL HIP ARTHROPLASTY     Right       Family History  Problem Relation Age of Onset   Cancer Mother    Hypertension Mother     Social History   Tobacco Use   Smoking status: Never   Smokeless tobacco: Never  Substance Use Topics   Alcohol use: No   Drug use: No    Home Medications Prior to Admission medications   Medication Sig Start Date End Date Taking? Authorizing Provider  amLODipine-olmesartan (AZOR) 5-40 MG tablet Take 1 tablet by mouth daily. Reported on 09/01/2015   Yes [provider]  Empagliflozin-metFORMIN HCl 12.5-500 MG TABS Take 2 tablets by mouth every morning.    Yes [provider]  glimepiride (AMARYL) 4 MG tablet Take 4 mg by mouth daily with breakfast.   Yes [provider]  meloxicam (MOBIC) 15 MG tablet Take 15 mg by mouth daily. Reported on 09/01/2015   Yes [provider]  Semaglutide,0.25 or 0.5MG /DOS, 2 MG/1.5ML SOPN Inject 0.5 mg into the skin every Tuesday.   Yes [provider]  cyclobenzaprine (FLEXERIL) 10 MG tablet Take 1 tablet (10 mg total) by mouth 3 (three) times daily as needed for muscle spasms. Patient not taking: Reported on 10/17/2020 12/06/16   Sherwood Gambler, MD  naproxen (NAPROSYN) 500 MG tablet Take 1 tablet (500 mg total) by mouth 2 (  two) times daily with a meal. Patient not taking: Reported on 10/17/2020 12/06/16   Sherwood Gambler, MD    Allergies    Patient has no allergy information on record.  Review of Systems   Review of Systems  Constitutional:  Negative for chills and fever.  HENT:  Negative for ear pain and sore throat.   Eyes:  Negative for pain and visual disturbance.  Respiratory:  Negative for cough and shortness of breath.   Cardiovascular:  Negative for chest pain and palpitations.  Gastrointestinal:  Positive for abdominal pain and diarrhea. Negative for nausea and vomiting.  Genitourinary:  Negative for dysuria and hematuria.  Musculoskeletal:   Negative for arthralgias and back pain.  Skin:  Negative for color change and rash.  Neurological:  Negative for seizures and syncope.  Psychiatric/Behavioral:  Negative for agitation and behavioral problems.   All other systems reviewed and are negative.  Physical Exam Updated Vital Signs BP 131/88 (BP Location: Right Arm)   Pulse 94   Temp 98 F (36.7 C) (Oral)   Resp 20   Ht 6\' 3"  (1.905 m)   Wt (!) 136.7 kg   SpO2 97%   BMI 37.67 kg/m   Physical Exam Vitals and nursing note reviewed.  Constitutional:      Appearance: He is well-developed.  HENT:     Head: Normocephalic and atraumatic.  Eyes:     Extraocular Movements: Extraocular movements intact.     Conjunctiva/sclera: Conjunctivae normal.     Pupils: Pupils are equal, round, and reactive to light.  Cardiovascular:     Rate and Rhythm: Normal rate and regular rhythm.     Heart sounds: No murmur heard. Pulmonary:     Effort: Pulmonary effort is normal. No respiratory distress.     Breath sounds: Normal breath sounds.  Abdominal:     General: There is distension.     Palpations: There is no shifting dullness, fluid wave or mass.     Tenderness: There is generalized abdominal tenderness and tenderness in the epigastric area. There is no right CVA tenderness or left CVA tenderness.  Musculoskeletal:     Cervical back: Neck supple.  Skin:    General: Skin is warm and dry.     Capillary Refill: Capillary refill takes less than 2 seconds.  Neurological:     General: No focal deficit present.     Mental Status: He is alert and oriented to person, place, and time.  Psychiatric:        Mood and Affect: Mood normal.        Behavior: Behavior normal.    ED Results / Procedures / Treatments   Labs (all labs ordered are listed, but only abnormal results are displayed) Labs Reviewed  COMPREHENSIVE METABOLIC PANEL - Abnormal; Notable for the following components:      Result Value   Glucose, Bld 113 (*)    Total  Protein 8.2 (*)    All other components within normal limits  CBC - Abnormal; Notable for the following components:   RBC 6.90 (*)    HCT 53.8 (*)    MCV 78.0 (*)    MCH 24.5 (*)    RDW 18.5 (*)    All other components within normal limits  HEMOGLOBIN A1C - Abnormal; Notable for the following components:   Hgb A1c MFr Bld 6.9 (*)    All other components within normal limits  CBC - Abnormal; Notable for the following components:   RBC 6.98 (*)  HCT 55.9 (*)    MCH 24.2 (*)    RDW 18.9 (*)    All other components within normal limits  CBC - Abnormal; Notable for the following components:   RBC 6.76 (*)    HCT 54.2 (*)    MCH 24.4 (*)    RDW 18.5 (*)    All other components within normal limits  COMPREHENSIVE METABOLIC PANEL - Abnormal; Notable for the following components:   Glucose, Bld 144 (*)    All other components within normal limits  RESP PANEL BY RT-PCR (FLU A&B, COVID) ARPGX2  LIPASE, BLOOD  HIV ANTIBODY (ROUTINE TESTING W REFLEX)  CREATININE, SERUM  GLUCOSE, CAPILLARY    EKG EKG Interpretation  Date/Time:  Tuesday October 17 2020 11:07:17 EDT Ventricular Rate:  87 PR Interval:  256 QRS Duration: 110 QT Interval:  394 QTC Calculation: 474 R Axis:   120 Text Interpretation: Sinus rhythm Prolonged PR interval Left posterior fascicular block Borderline low voltage, extremity leads Consider anterior infarct Confirmed by Malvin Johns [phone]) on 10/17/2020 12:00:39 PM  Radiology CT ABDOMEN PELVIS W CONTRAST  Result Date: 10/17/2020 CLINICAL DATA:  Acute abdominal pain.  Diarrhea. EXAM: CT ABDOMEN AND PELVIS WITH CONTRAST TECHNIQUE: Multidetector CT imaging of the abdomen and pelvis was performed using the standard protocol following bolus administration of intravenous contrast. CONTRAST:  165mL OMNIPAQUE IOHEXOL 350 MG/ML SOLN COMPARISON:  None. FINDINGS: Lower Chest: No acute findings. Hepatobiliary: No hepatic masses identified. Gallbladder is unremarkable. No  evidence of biliary ductal dilatation. Pancreas:  No mass or inflammatory changes. Spleen: Within normal limits in size and appearance. Adrenals/Urinary Tract: No masses identified. A 6 mm calculus is seen in the interpolar region of the right kidney. No evidence of ureteral calculi or hydronephrosis. Unremarkable unopacified urinary bladder. Stomach/Bowel: Moderately dilated small bowel loops are seen in the abdomen and. A transition point is seen in the right abdomen, suspicious for adhesion. No soft tissue mass or inflammatory process identified. No abnormal fluid collections are seen. Vascular/Lymphatic: No pathologically enlarged lymph nodes. No acute vascular findings. Reproductive:  No mass or other significant abnormality. Other:  None. Musculoskeletal: No suspicious bone lesions identified. Right hip prosthesis again noted. IMPRESSION: Partial small bowel obstruction, with transition point in right abdomen, suspicious for adhesion. Right nephrolithiasis. No evidence of ureteral calculi or hydronephrosis. Electronically Signed   By: Marlaine Hind M.D.   On: 10/17/2020 15:11   DG Abd Portable 1V-Small Bowel Protocol-Position Verification  Result Date: 10/17/2020 CLINICAL DATA:  NG tube placement EXAM: PORTABLE ABDOMEN - 1 VIEW COMPARISON:  10/17/2020 at 1614 hours FINDINGS: Enteric tube terminates in the proximal gastric body. IMPRESSION: Enteric tube terminates in the proximal gastric body. Electronically Signed   By: Julian Hy M.D.   On: 10/17/2020 19:17   DG Abd Portable 1V-Small Bowel Obstruction Protocol-initial, 8 hr delay  Result Date: 10/17/2020 CLINICAL DATA:  NG tube placement.  Small-bowel obstruction. EXAM: PORTABLE ABDOMEN - 1 VIEW COMPARISON:  CT earlier today. FINDINGS: Tip of the enteric tube is below the diaphragm in the stomach, the side port is in the region of the gastroesophageal junction. Recommend advancement of at least 5 cm for optimal placement. Gaseous gastric  distension is well as prominent small bowel corresponding to obstruction on prior CT. Right nephrolithiasis on CT not well seen on the current exam. IMPRESSION: 1. Tip of the enteric tube below the diaphragm in the stomach, side-port in the region of the gastroesophageal junction. Recommend advancement of at least 5  cm for optimal placement. 2. Small bowel obstruction as seen on CT. Electronically Signed   By: Keith Rake M.D.   On: 10/17/2020 16:30   US Abdomen Limited RUQ (LIVER/GB)  Result Date: 10/17/2020 CLINICAL DATA:  Right upper quadrant abdominal pain for 2 days. EXAM: ULTRASOUND ABDOMEN LIMITED RIGHT UPPER QUADRANT COMPARISON:  None. FINDINGS: Gallbladder: No gallstones or wall thickening visualized. No sonographic Murphy sign noted by sonographer. Common bile duct: Diameter: 4.2 mm Liver: There is diffuse increased echogenicity of the liver and decreased through transmission consistent with fatty infiltration. No focal lesions or biliary dilatation. Portal vein is patent on color Doppler imaging with normal direction of blood flow towards the liver. Other: None. IMPRESSION: 1. Normal gallbladder and normal caliber common bile duct. 2. Fatty infiltration of the liver but no hepatic lesions. Electronically Signed   By: Marijo Sanes M.D.   On: 10/17/2020 13:07    Procedures Procedures   Medications Ordered in ED Medications  insulin aspart (novoLOG) injection 0-20 Units (has no administration in time range)  insulin aspart (novoLOG) injection 0-5 Units (0 Units Subcutaneous Not Given 10/17/20 2233)  enoxaparin (LOVENOX) injection 60 mg (60 mg Subcutaneous Given 10/17/20 2123)  dextrose 5 % in lactated ringers infusion ( Intravenous New Bag/Given 10/18/20 0446)  morphine 2 MG/ML injection 2 mg (2 mg Intravenous Given 10/17/20 1951)  ondansetron (ZOFRAN) injection 4 mg (has no administration in time range)  metoprolol tartrate (LOPRESSOR) injection 5 mg (has no administration in time range)   alum & mag hydroxide-simeth (MAALOX/MYLANTA) 200-200-20 MG/5ML suspension 30 mL (30 mLs Oral Given 10/17/20 1110)    And  lidocaine (XYLOCAINE) 2 % viscous mouth solution 15 mL (15 mLs Oral Given 10/17/20 1112)  iohexol (OMNIPAQUE) 9 MG/ML oral solution 500 mL (500 mLs Oral Contrast Given 10/17/20 1415)  iohexol (OMNIPAQUE) 350 MG/ML injection 100 mL (100 mLs Intravenous Contrast Given 10/17/20 1425)  diatrizoate meglumine-sodium (GASTROGRAFIN) 66-10 % solution 90 mL (90 mLs Per NG tube Given 10/17/20 2300)  LORazepam (ATIVAN) injection 1 mg (1 mg Intravenous Given 10/17/20 1616)    ED Course  I have reviewed the triage vital signs and the nursing notes.  Pertinent labs & imaging results that were available during my care of the patient were reviewed by me and considered in my medical decision making (see chart for details).    MDM Rules/Calculators/A&P                           Abdominal Pain Patient has abdominal pain that is diffuse in the upper quadrants and in the epigastric region which has burning quality. Ddx GERD vs gastritis vs pancreatitis vs cholecystitis vs SBO. The burning quality made GERD a likely diagnosis. This along with the cough at night time pointed towards this diagnosis. GI cocktail was given and the pain went from 8/10 to 4/10. RUQ ultrasound was done because of RUQ pain presence but was negative. Lipase was negative decreasing presence of the likelihood of pancreatitis. Patient continued to have pain so CT of abdomen and pelvis was undertaken. CT showed partial small bowel obstruction. NG tube was ordered and hospitalist and surgery teams were consulted. Patient may have underlying GERD but current diagnosis is partial small bowel obstruction as indicated by the CT.   Plan -Admit to inpatient -Surgery consult -Pain control -NG tube for decompression  Final Clinical Impression(s) / ED Diagnoses Final diagnoses:  RUQ pain  SBO (small bowel obstruction) ([place])  Rx / DC Orders ED Discharge Orders     None        Idamae Schuller, MD 10/18/20 5625    Malvin Johns, MD 10/18/20 [id number]

## 2020-10-18 NOTE — Progress Notes (Signed)
PROGRESS NOTE    Jerry Hart  XFG:[id number] DOB: 07-13-1970 DOA: 10/17/2020 PCP: System, Provider Not In    Brief Narrative:  Mr. Jerry Hart was admitted to the hospital with the working diagnosis of partial small bowel obstruction.   50 year old male past medical history for type 2 diabetes mellitus, gout, OSA, obesity class III and osteoarthritis who presented with abdominal pain and diarrhea.  Reported 2 days of no bowel movements but positive flatus.  On his initial physical examination temperature 99.1, blood pressure 155/99, heart rate 100, respiratory rate 20, oxygen saturation 95% on room air.  He had dry mucous membranes, his lungs were clear to auscultation bilaterally, heart S1-S2, present, tachycardic, his abdomen had diffuse distention, tender to palpation, positive bowel sounds, no lower extremity edema.  Sodium 136, potassium 4.2, chloride 102, bicarb 23, glucose 113, BUN 13, creatinine 0.73, white count 6.7, hemoglobin 16.9, hematocrit 33.8, platelets 268. SARS COVID-19 negative.  CT of the abdomen and pelvis showed partial small bowel obstruction with transition point in the right abdomen, suspicion for adhesions.  Right nephrolithiasis, no hydronephrosis.   EKG 87 bpm, rightward axis, first-degree AV block, sinus rhythm, no significant ST segment or T wave changes.  Assessment & Plan:   Principal Problem:   SBO (small bowel obstruction) ([facility]) Active Problems:   Diabetes mellitus ([place])   Hypercholesterolemia   Morbid obesity due to excess calories ([place])   Shift work sleep disorder   Essential hypertension   Partial small bowel obstruction. Patient with improvement in his abdominal pain, positive flatus, no nausea or vomiting. NG tube has been clamped. Plan to remove NG tube later today after radiographic follow up.  Continue supportive medical care with IV fluids, antiacids and as needed analgesics and antiemetics.  Out of bed to chair tid Ambulate in the  hallway.  Follow with surgery recommendations.   2. T2DM. Fasting glucose is 144, capillary 135 and 112. Patient on oral antihyperglycemic agents at home, he has been NPO due to bowel obstruction.  Plan to discontinue sq insulin and continue as needed CBG.  Discontinue IV dextrose and continue hydration with balanced electrolyte solutions with LR.  (At home on empagliflozin, metformin and semaglutide)  3. HTN. Blood pressure this am was 131/88 mmHg, continue close monitoring.  Holding antihypertensive medications at this time. (At home on amlodipine and losartan).  4. Obesity class 2. Calculated BMI is 37.6, will need outpatient follow up.    Patient continue to be at high risk for worsening bowel obstruction   Status is: Inpatient  Remains inpatient appropriate because:Inpatient level of care appropriate due to severity of illness  Dispo: The patient is from: Home              Anticipated d/c is to: Home              Patient currently is not medically stable to d/c.   Difficult to place patient No   DVT prophylaxis:  Enoxaparin   Code Status:   full  Family Communication:  No family at the bedside      Consultants:  Surgery     Subjective: Patient is feeling better, his abdominal pain has improved, positive flatus, no nausea or vomiting. Still has NG tube in place.   Objective: Vitals:   10/17/20 1700 10/17/20 1805 10/17/20 2156 10/18/20 0528  BP: (!) 145/89 (!) 132/94 139/90 131/88  Pulse: 96 99 100 94  Resp: 19 20 20 20   Temp:  98.3 F (36.8 C)  98 F (36.7 C) 98 F (36.7 C)  TempSrc:  Oral Oral Oral  SpO2: 96% 99% 95% 97%  Weight:  (!) 136.7 kg    Height:  6\' 3"  (1.905 m)      Intake/Output Summary (Last 24 hours) at 10/18/2020 1151 Last data filed at 10/18/2020 4098 Gross per 24 hour  Intake 1278.34 ml  Output 4700 ml  Net -3421.66 ml   Filed Weights   10/17/20 1010 10/17/20 1805  Weight: (!) 140.6 kg (!) 136.7 kg    Examination:   General:  Not in pain or dyspnea, deconditioned  Neurology: Awake and alert, non focal  E ENT: mild pallor, no icterus, oral mucosa moist Cardiovascular: No JVD. S1-S2 present, rhythmic, no gallops, rubs, or murmurs. No lower extremity edema. Pulmonary: positive breath sounds bilaterally, adequate air movement, no wheezing, rhonchi or rales. Gastrointestinal. Abdomen mild distended, positive protuberant but not tender, soft Skin. No rashes Musculoskeletal: no joint deformities     Data Reviewed: I have personally reviewed following labs and imaging studies  CBC: Recent Labs  Lab 10/17/20 1019 10/17/20 1950 10/18/20 0424  WBC 6.7 6.2 4.7  HGB 16.9 16.9 16.5  HCT 53.8* 55.9* 54.2*  MCV 78.0* 80.1 80.2  PLT 268 304 119   Basic Metabolic Panel: Recent Labs  Lab 10/17/20 1019 10/17/20 1950 10/18/20 0424  NA 136  --  139  K 4.2  --  3.9  CL 102  --  102  CO2 23  --  24  GLUCOSE 113*  --  144*  BUN 13  --  16  CREATININE 0.73 0.84 0.78  CALCIUM 9.6  --  9.1   GFR: Estimated Creatinine Clearance: 168.3 mL/min (by C-G formula based on SCr of 0.78 mg/dL). Liver Function Tests: Recent Labs  Lab 10/17/20 1019 10/18/20 0424  AST 19 15  ALT 24 22  ALKPHOS 89 73  BILITOT 0.9 1.0  PROT 8.2* 7.3  ALBUMIN 4.3 3.7   Recent Labs  Lab 10/17/20 1019  LIPASE 15   No results for input(s): AMMONIA in the last 168 hours. Coagulation Profile: No results for input(s): INR, PROTIME in the last 168 hours. Cardiac Enzymes: No results for input(s): CKTOTAL, CKMB, CKMBINDEX, TROPONINI in the last 168 hours. BNP (last 3 results) No results for input(s): PROBNP in the last 8760 hours. HbA1C: Recent Labs    10/17/20 1950  HGBA1C 6.9*   CBG: Recent Labs  Lab 10/17/20 2152 10/18/20 0738 10/18/20 1121  GLUCAP 97 135* 112*   Lipid Profile: No results for input(s): CHOL, HDL, LDLCALC, TRIG, CHOLHDL, LDLDIRECT in the last 72 hours. Thyroid Function Tests: No results for input(s): TSH,  T4TOTAL, FREET4, T3FREE, THYROIDAB in the last 72 hours. Anemia Panel: No results for input(s): VITAMINB12, FOLATE, FERRITIN, TIBC, IRON, RETICCTPCT in the last 72 hours.    Radiology Studies: I have reviewed all of the imaging during this hospital visit personally     Scheduled Meds:  enoxaparin (LOVENOX) injection  60 mg Subcutaneous Q24H   insulin aspart  0-20 Units Subcutaneous TID WC   insulin aspart  0-5 Units Subcutaneous QHS   Continuous Infusions:  dextrose 5% lactated ringers 125 mL/hr at 10/18/20 0446     LOS: 1 day        Zanaiya Calabria Gerome Apley, MD

## 2020-10-18 NOTE — Consult Note (Addendum)
[facility] Surgery Consult Note  Jerry Hart 09/17/1970  [id number].    Requesting MD: Dr. Lurline Del Chief Complaint:  nausea/vomiting, diarrhea, & abdominal pain Reason for Consult: SBO  HPI:  Patient is a 50 year old male who presented to the [facility] ED, with nausea,  abdominal pain, and distention. He has no prior history of of abdominal surgery.  He has never had an issue with this before.  No history of IBS, he reports just taking small meals.  No prior history of constipation, diarrhea, or blood in his stool.  Medical issues include: Diabetes he reports well controlled, Hypertension, gout, obstructive sleep apnea, morbid obesity, and osteoarthritis.  Work-up shows he is afebrile T-max 99.1 vital signs are stable with slightly elevated diastolic blood pressure.  Admission labs shows glucose of 113>> 144.  The remainder the CMP is normal.  WBC 6.7>> 4.7.  H/H 16.5/54.2, platelet count of 67,000.  Respiratory panel is negative.  HIV is negative.  Abdominal ultrasound showed normal gallbladder with normal 4.2 mm common bile duct.  Liver shows fatty infiltration with no focal lesions or biliary dilatation portal vein is patent with blood flow in the normal direction.  CT of the abdomen showed a transition point in the right abdomen, suspicious for adhesions, with partial small bowel obstruction.  He also has right nephrolithiasis with no evidence of ureteral calculi or hydronephrosis.  NG was placed last p.m.  A.m. film this morning shows a normal bowel pattern with residual contrast throughout the colon and in the stomach NG tube is seen in the proximal stomach.  INO shows 1278 IV yesterday, 1650 urine, 3050 NG yesterday.  We are asked to see.  ROS: Review of Systems  Constitutional: Negative.   HENT: Negative.    Eyes: Negative.   Respiratory: Negative.         He has sleep apnea and has a CPAP he uses intermittently; he is not sure its useful.  Cardiovascular: Negative.    Gastrointestinal:  Positive for abdominal pain, diarrhea and nausea. Negative for blood in stool, constipation, melena and vomiting.  Genitourinary: Negative.   Musculoskeletal: Negative.   Skin: Negative.   Neurological: Negative.   Endo/Heme/Allergies: Negative.   Psychiatric/Behavioral: Negative.     Family History  Problem Relation Age of Onset   Cancer Mother    Hypertension Mother     Past Medical History:  Diagnosis Date   Arthritis    in his hip   Chest pain 08/14/2012   Diabetes mellitus without complication ([place])    TYPE 2   Diabetes mellitus without complication ([place])    DOE (dyspnea on exertion)    whenever he is working hard   Gout    left fingers   Hypertension    Sleep apnea     Past Surgical History:  Procedure Laterality Date   CARDIAC CATHETERIZATION     2014   HIP PINNING Right 1985   LEFT HEART CATHETERIZATION WITH CORONARY ANGIOGRAM N/A 08/17/2012   Procedure: LEFT HEART CATHETERIZATION WITH CORONARY ANGIOGRAM;  Surgeon: Birdie Riddle, MD;  Location: [facility] CATH [facility];  Service: Cardiovascular;  Laterality: N/A;   TOTAL HIP ARTHROPLASTY Right 03/28/2014   Procedure: RIGHT TOTAL HIP ARTHROPLASTY;  Surgeon: Kerin Salen, MD;  Location: [facility];  Service: Orthopedics;  Laterality: Right;   TOTAL HIP ARTHROPLASTY     Right    Social History:  reports that he has never smoked. He has never used smokeless tobacco. He reports that he  does not drink alcohol and does not use drugs. 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 Alcohol: None Drugs: None Tobacco: None  Allergies: Not on File  Medications Prior to Admission  Medication Sig Dispense Refill   amLODipine-olmesartan (AZOR) 5-40 MG tablet Take 1 tablet by mouth daily. Reported on 09/01/2015     Empagliflozin-metFORMIN HCl 12.5-500 MG TABS Take 2 tablets by mouth every morning.      glimepiride (AMARYL) 4 MG tablet Take 4 mg by mouth daily with breakfast.     meloxicam (MOBIC) 15 MG tablet Take 15 mg  by mouth daily. Reported on 09/01/2015     Semaglutide,0.25 or 0.5MG /DOS, 2 MG/1.5ML SOPN Inject 0.5 mg into the skin every Tuesday.     cyclobenzaprine (FLEXERIL) 10 MG tablet Take 1 tablet (10 mg total) by mouth 3 (three) times daily as needed for muscle spasms. (Patient not taking: Reported on 10/17/2020) 15 tablet 0   naproxen (NAPROSYN) 500 MG tablet Take 1 tablet (500 mg total) by mouth 2 (two) times daily with a meal. (Patient not taking: Reported on 10/17/2020) 14 tablet 0    Blood pressure 131/88, pulse 94, temperature 98 F (36.7 C), temperature source Oral, resp. rate 20, height 6\' 3"  (1.905 m), weight (!) 136.7 kg, SpO2 97 %. Physical Exam:  General: pleasant, WD, WN AA male who is laying in bed in NAD HEENT: head is normocephalic, atraumatic.  NG is in place.  Sclera are noninjected.  Pupils are equal.  Ears and nose without any masses or lesions.  Mouth is pink and moist Heart: regular, rate, and rhythm.  Normal s1,s2. No obvious murmurs, gallops, or rubs noted.  Palpable radial and pedal pulses bilaterally Lungs: CTAB, no wheezes, rhonchi, or rales noted.  Respiratory effort nonlabored Abd: Large soft abdomen, NT, ND, +BS, no masses, hernias, or organomegaly. MS: all 4 extremities are symmetrical with no cyanosis, clubbing, or edema.S/p right hip replacement Skin: warm and dry with no masses, lesions, or rashes Neuro: Cranial nerves 2-12 grossly intact, sensation is normal throughout Psych: A&Ox3 with an appropriate affect.   Results for orders placed or performed during the hospital encounter of 10/17/20 (from the past 48 hour(s))  Lipase, blood     Status: None   Collection Time: 10/17/20 10:19 AM  Result Value Ref Range   Lipase 15 11 - 51 U/L    Comment: Performed at [facility], [place], [place] [address]  Comprehensive metabolic panel     Status: Abnormal   Collection Time: 10/17/20 10:19 AM  Result Value Ref Range   Sodium 136 135 -  145 mmol/L   Potassium 4.2 3.5 - 5.1 mmol/L   Chloride 102 98 - 111 mmol/L   CO2 23 22 - 32 mmol/L   Glucose, Bld 113 (H) 70 - 99 mg/dL    Comment: Glucose reference range applies only to samples taken after fasting for at least 8 hours.   BUN 13 6 - 20 mg/dL   Creatinine, Ser 0.73 0.61 - 1.24 mg/dL   Calcium 9.6 8.9 - 10.3 mg/dL   Total Protein 8.2 (H) 6.5 - 8.1 g/dL   Albumin 4.3 3.5 - 5.0 g/dL   AST 19 15 - 41 U/L   ALT 24 0 - 44 U/L   Alkaline Phosphatase 89 38 - 126 U/L   Total Bilirubin 0.9 0.3 - 1.2 mg/dL   GFR, Estimated >60 >60 mL/min    Comment: (NOTE) Calculated using the CKD-EPI Creatinine  Equation (2021)    Anion gap 11 5 - 15    Comment: Performed at [facility], [address] [place], [place] [address]  CBC     Status: Abnormal   Collection Time: 10/17/20 10:19 AM  Result Value Ref Range   WBC 6.7 4.0 - 10.5 K/uL   RBC 6.90 (H) 4.22 - 5.81 MIL/uL   Hemoglobin 16.9 13.0 - 17.0 g/dL   HCT 53.8 (H) 39.0 - 52.0 %   MCV 78.0 (L) 80.0 - 100.0 fL   MCH 24.5 (L) 26.0 - 34.0 pg   MCHC 31.4 30.0 - 36.0 g/dL   RDW 18.5 (H) 11.5 - 15.5 %   Platelets 268 150 - 400 K/uL   nRBC 0.0 0.0 - 0.2 %    Comment: Performed at [facility], [address] [place], [place] [address]  Resp Panel by RT-PCR (Flu A&B, Covid) Nasopharyngeal Swab     Status: None   Collection Time: 10/17/20  4:01 PM   Specimen: Nasopharyngeal Swab; Nasopharyngeal(NP) swabs in vial transport medium  Result Value Ref Range   SARS Coronavirus 2 by RT PCR NEGATIVE NEGATIVE    Comment: (NOTE) SARS-CoV-2 target nucleic acids are NOT DETECTED.  The SARS-CoV-2 RNA is generally detectable in upper respiratory specimens during the acute phase of infection. The lowest concentration of SARS-CoV-2 viral copies this assay can detect is 138 copies/mL. A negative result does not preclude SARS-Cov-2 infection and should not be used as the sole basis for treatment or other  patient management decisions. A negative result may occur with  improper specimen collection/handling, submission of specimen other than nasopharyngeal swab, presence of viral mutation(s) within the areas targeted by this assay, and inadequate number of viral copies(<138 copies/mL). A negative result must be combined with clinical observations, patient history, and epidemiological information. The expected result is Negative.  Fact Sheet for Patients:  EntrepreneurPulse.com.au  Fact Sheet for Healthcare Providers:  IncredibleEmployment.be  This test is no t yet approved or cleared by the [place] FDA and  has been authorized for detection and/or diagnosis of SARS-CoV-2 by FDA under an Emergency Use Authorization (EUA). This EUA will remain  in effect (meaning this test can be used) for the duration of the COVID-19 declaration under Section 564(b)(1) of the Act, 21 U.S.C.section 360bbb-3(b)(1), unless the authorization is terminated  or revoked sooner.       Influenza A by PCR NEGATIVE NEGATIVE   Influenza B by PCR NEGATIVE NEGATIVE    Comment: (NOTE) The Xpert Xpress SARS-CoV-2/FLU/RSV plus assay is intended as an aid in the diagnosis of influenza from Nasopharyngeal swab specimens and should not be used as a sole basis for treatment. Nasal washings and aspirates are unacceptable for Xpert Xpress SARS-CoV-2/FLU/RSV testing.  Fact Sheet for Patients: EntrepreneurPulse.com.au  Fact Sheet for Healthcare Providers: IncredibleEmployment.be  This test is not yet approved or cleared by the [place] FDA and has been authorized for detection and/or diagnosis of SARS-CoV-2 by FDA under an Emergency Use Authorization (EUA). This EUA will remain in effect (meaning this test can be used) for the duration of the COVID-19 declaration under Section 564(b)(1) of the Act, 21 U.S.C. section 360bbb-3(b)(1), unless  the authorization is terminated or revoked.  Performed at [facility], [address] [place], [place] [address]   Hemoglobin A1c     Status: Abnormal   Collection Time: 10/17/20  7:50 PM  Result Value Ref Range   Hgb A1c MFr Bld 6.9 (H)  4.8 - 5.6 %    Comment: (NOTE) Pre diabetes:          5.7%-6.4%  Diabetes:              >6.4%  Glycemic control for   <7.0% adults with diabetes    Mean Plasma Glucose 151.33 mg/dL    Comment: Performed at [facility], [place] [address] [place], [place] [address]  HIV Antibody (routine testing w rflx)     Status: None   Collection Time: 10/17/20  7:50 PM  Result Value Ref Range   HIV Screen 4th Generation wRfx Non Reactive Non Reactive    Comment: Performed at [facility], [place] [address], [place], [place] [address]  CBC     Status: Abnormal   Collection Time: 10/17/20  7:50 PM  Result Value Ref Range   WBC 6.2 4.0 - 10.5 K/uL   RBC 6.98 (H) 4.22 - 5.81 MIL/uL   Hemoglobin 16.9 13.0 - 17.0 g/dL   HCT 55.9 (H) 39.0 - 52.0 %   MCV 80.1 80.0 - 100.0 fL   MCH 24.2 (L) 26.0 - 34.0 pg   MCHC 30.2 30.0 - 36.0 g/dL   RDW 18.9 (H) 11.5 - 15.5 %   Platelets 304 150 - 400 K/uL   nRBC 0.0 0.0 - 0.2 %    Comment: Performed at [facility], [place] [address] [place], [place] [address]  Creatinine, serum     Status: None   Collection Time: 10/17/20  7:50 PM  Result Value Ref Range   Creatinine, Ser 0.84 0.61 - 1.24 mg/dL   GFR, Estimated >60 >60 mL/min    Comment: (NOTE) Calculated using the CKD-EPI Creatinine Equation (2021) Performed at [facility], [place] [address], [place], [place] [address]   Glucose, capillary     Status: None   Collection Time: 10/17/20  9:52 PM  Result Value Ref Range   Glucose-Capillary 97 70 - 99 mg/dL    Comment: Glucose reference range applies only to samples taken after fasting for at least 8 hours.  CBC     Status: Abnormal   Collection Time:  10/18/20  4:24 AM  Result Value Ref Range   WBC 4.7 4.0 - 10.5 K/uL   RBC 6.76 (H) 4.22 - 5.81 MIL/uL   Hemoglobin 16.5 13.0 - 17.0 g/dL   HCT 54.2 (H) 39.0 - 52.0 %   MCV 80.2 80.0 - 100.0 fL   MCH 24.4 (L) 26.0 - 34.0 pg   MCHC 30.4 30.0 - 36.0 g/dL   RDW 18.5 (H) 11.5 - 15.5 %   Platelets 267 150 - 400 K/uL   nRBC 0.0 0.0 - 0.2 %    Comment: Performed at [facility], [place] [address] [place], [place] [address]  Comprehensive metabolic panel     Status: Abnormal   Collection Time: 10/18/20  4:24 AM  Result Value Ref Range   Sodium 139 135 - 145 mmol/L   Potassium 3.9 3.5 - 5.1 mmol/L   Chloride 102 98 - 111 mmol/L   CO2 24 22 - 32 mmol/L   Glucose, Bld 144 (H) 70 - 99 mg/dL    Comment: Glucose reference range applies only to samples taken after fasting for at least 8 hours.   BUN 16 6 - 20 mg/dL   Creatinine, Ser 0.78 0.61 - 1.24 mg/dL   Calcium 9.1 8.9 - 10.3 mg/dL   Total Protein 7.3 6.5 -  8.1 g/dL   Albumin 3.7 3.5 - 5.0 g/dL   AST 15 15 - 41 U/L   ALT 22 0 - 44 U/L   Alkaline Phosphatase 73 38 - 126 U/L   Total Bilirubin 1.0 0.3 - 1.2 mg/dL   GFR, Estimated >60 >60 mL/min    Comment: (NOTE) Calculated using the CKD-EPI Creatinine Equation (2021)    Anion gap 13 5 - 15    Comment: Performed at [facility], [place] [address], [place], [place] [address]  Glucose, capillary     Status: Abnormal   Collection Time: 10/18/20  7:38 AM  Result Value Ref Range   Glucose-Capillary 135 (H) 70 - 99 mg/dL    Comment: Glucose reference range applies only to samples taken after fasting for at least 8 hours.   DG Abd 1 View  Result Date: 10/18/2020 CLINICAL DATA:  Small bowel obstruction. EXAM: ABDOMEN - 1 VIEW COMPARISON:  October 17, 2020. FINDINGS: The bowel gas pattern is normal. Residual contrast is noted throughout the colon and in the stomach. Nasogastric tube tip is seen in proximal stomach. IMPRESSION: Nasogastric tube tip seen in proximal  stomach. No abnormal bowel dilatation is noted. Residual contrast is noted in the colon. Electronically Signed   By: Marijo Conception M.D.   On: 10/18/2020 08:36   CT ABDOMEN PELVIS W CONTRAST  Result Date: 10/17/2020 CLINICAL DATA:  Acute abdominal pain.  Diarrhea. EXAM: CT ABDOMEN AND PELVIS WITH CONTRAST TECHNIQUE: Multidetector CT imaging of the abdomen and pelvis was performed using the standard protocol following bolus administration of intravenous contrast. CONTRAST:  131mL OMNIPAQUE IOHEXOL 350 MG/ML SOLN COMPARISON:  None. FINDINGS: Lower Chest: No acute findings. Hepatobiliary: No hepatic masses identified. Gallbladder is unremarkable. No evidence of biliary ductal dilatation. Pancreas:  No mass or inflammatory changes. Spleen: Within normal limits in size and appearance. Adrenals/Urinary Tract: No masses identified. A 6 mm calculus is seen in the interpolar region of the right kidney. No evidence of ureteral calculi or hydronephrosis. Unremarkable unopacified urinary bladder. Stomach/Bowel: Moderately dilated small bowel loops are seen in the abdomen and. A transition point is seen in the right abdomen, suspicious for adhesion. No soft tissue mass or inflammatory process identified. No abnormal fluid collections are seen. Vascular/Lymphatic: No pathologically enlarged lymph nodes. No acute vascular findings. Reproductive:  No mass or other significant abnormality. Other:  None. Musculoskeletal: No suspicious bone lesions identified. Right hip prosthesis again noted. IMPRESSION: Partial small bowel obstruction, with transition point in right abdomen, suspicious for adhesion. Right nephrolithiasis. No evidence of ureteral calculi or hydronephrosis. Electronically Signed   By: Marlaine Hind M.D.   On: 10/17/2020 15:11   DG Abd Portable 1V-Small Bowel Protocol-Position Verification  Result Date: 10/17/2020 CLINICAL DATA:  NG tube placement EXAM: PORTABLE ABDOMEN - 1 VIEW COMPARISON:  10/17/2020 at 1614  hours FINDINGS: Enteric tube terminates in the proximal gastric body. IMPRESSION: Enteric tube terminates in the proximal gastric body. Electronically Signed   By: Julian Hy M.D.   On: 10/17/2020 19:17   DG Abd Portable 1V-Small Bowel Obstruction Protocol-initial, 8 hr delay  Result Date: 10/17/2020 CLINICAL DATA:  NG tube placement.  Small-bowel obstruction. EXAM: PORTABLE ABDOMEN - 1 VIEW COMPARISON:  CT earlier today. FINDINGS: Tip of the enteric tube is below the diaphragm in the stomach, the side port is in the region of the gastroesophageal junction. Recommend advancement of at least 5 cm for optimal placement. Gaseous gastric distension is well as prominent small  bowel corresponding to obstruction on prior CT. Right nephrolithiasis on CT not well seen on the current exam. IMPRESSION: 1. Tip of the enteric tube below the diaphragm in the stomach, side-port in the region of the gastroesophageal junction. Recommend advancement of at least 5 cm for optimal placement. 2. Small bowel obstruction as seen on CT. Electronically Signed   By: Keith Rake M.D.   On: 10/17/2020 16:30   US Abdomen Limited RUQ (LIVER/GB)  Result Date: 10/17/2020 CLINICAL DATA:  Right upper quadrant abdominal pain for 2 days. EXAM: ULTRASOUND ABDOMEN LIMITED RIGHT UPPER QUADRANT COMPARISON:  None. FINDINGS: Gallbladder: No gallstones or wall thickening visualized. No sonographic Murphy sign noted by sonographer. Common bile duct: Diameter: 4.2 mm Liver: There is diffuse increased echogenicity of the liver and decreased through transmission consistent with fatty infiltration. No focal lesions or biliary dilatation. Portal vein is patent on color Doppler imaging with normal direction of blood flow towards the liver. Other: None. IMPRESSION: 1. Normal gallbladder and normal caliber common bile duct. 2. Fatty infiltration of the liver but no hepatic lesions. Electronically Signed   By: Marijo Sanes M.D.   On: 10/17/2020  13:07    dextrose 5% lactated ringers 125 mL/hr at 10/18/20 0446      Assessment/Plan   SBO/no prior abdominal surgery  -Patient had an NG placed and drained over 3 L last evening.  Film this a.m. shows some residual contrast in the stomach and contrast throughout the colon.  He is feeling much better this a.m. Plan: NG clamping trials, sips and chips, if he does well advance his diet and remove the NG later today.  FEN:  NPO/IV fluids ID: None DVT: Lovenox  Type 2 diabetes Hypertension Obstructive sleep apnea -CPAP Morbid obesity BMI 37.67 Osteoarthritis S/P right hip replacement 2015 Hx CAD -NM myocardial scan 2015 -small anterior septal infarct, no reversible ischemia, global hypokinesis, EF 33%  -Cardiac cath 08/16/2012 -results not released  1420 hrs - doing well with NG clamped, + flatus, OK with sips and chips.  Discussed with nursing and they will give clears and pull NG. Recheck film in [place], [facility] 10/18/2020, 9:17 AM Please see Amion for pager number during day hours 7:00am-4:30pm

## 2020-10-18 NOTE — Progress Notes (Signed)
Nutrition Brief Note  Patient identified on the Malnutrition Screening Tool (MST) Report  Pt currently NPO, NGT in place but per chart review, plan is for removal later today. Pt reports no weight changes recently and eating well prior to 7/18.   Wt Readings from Last 15 Encounters:  10/17/20 (!) 136.7 kg  12/06/16 (!) 138.3 kg  09/14/16 (!) 138.9 kg  09/01/15 (!) 145.6 kg  06/14/14 (!) 143.8 kg  03/28/14 (!) 143.1 kg  03/18/14 (!) 143.2 kg  05/11/13 (!) 143.3 kg  08/17/12 (!) 138.2 kg    Body mass index is 37.67 kg/m. Patient meets criteria for obesity based on current BMI.   Current diet order is NPO. Labs and medications reviewed.   No nutrition interventions warranted at this time. If nutrition issues arise, please consult RD.   Clayton Bibles, MS, RD, LDN Inpatient Clinical Dietitian Contact information available via Amion

## 2020-10-19 ENCOUNTER — Inpatient Hospital Stay (HOSPITAL_COMMUNITY): Payer: BLUE CROSS/BLUE SHIELD

## 2020-10-19 DIAGNOSIS — G4726 Circadian rhythm sleep disorder, shift work type: Secondary | ICD-10-CM

## 2020-10-19 DIAGNOSIS — Z6837 Body mass index (BMI) 37.0-37.9, adult: Secondary | ICD-10-CM

## 2020-10-19 LAB — BASIC METABOLIC PANEL
Anion gap: 10 (ref 5–15)
BUN: 12 mg/dL (ref 6–20)
CO2: 25 mmol/L (ref 22–32)
Calcium: 9.1 mg/dL (ref 8.9–10.3)
Chloride: 101 mmol/L (ref 98–111)
Creatinine, Ser: 0.67 mg/dL (ref 0.61–1.24)
GFR, Estimated: >60 mL/min (ref >60)
Glucose, Bld: 126 mg/dL — ABNORMAL HIGH (ref 70–99)
Potassium: 3.8 mmol/L (ref 3.5–5.1)
Sodium: 136 mmol/L (ref 135–145)

## 2020-10-19 NOTE — Discharge Summary (Signed)
Physician Discharge Summary  Jerry Hart [id number] DOB: [birth year] DOA: 10/17/2020  PCP: System, Provider Not In  Admit date: 10/17/2020 Discharge date: 10/19/2020  Admitted From: Home  Disposition:   Home   Recommendations for Outpatient Follow-up and new medication changes:  Follow up with Primary Care in 7 days.  Continue to advance diet as tolerated.   Home Health: no   Equipment/Devices: no    Discharge Condition: stable  CODE STATUS: full  Diet recommendation:  regular.   Brief/Interim Summary: Jerry Hart was admitted to the hospital with the working diagnosis of partial small bowel obstruction.    50 year old male past medical history for type 2 diabetes mellitus, gout, OSA, obesity class III and osteoarthritis who presented with abdominal pain and diarrhea.  Reported 2 days of no bowel movements but positive flatus.  On his initial physical examination temperature 99.1, blood pressure 155/99, heart rate 100, respiratory rate 20, oxygen saturation 95% on room air.  He had dry mucous membranes, his lungs were clear to auscultation bilaterally, heart S1-S2, present, tachycardic, his abdomen had diffuse distention, tender to palpation, positive bowel sounds, no lower extremity edema.   Sodium 136, potassium 4.2, chloride 102, bicarb 23, glucose 113, BUN 13, creatinine 0.73, white count 6.7, hemoglobin 16.9, hematocrit 33.8, platelets 268. SARS COVID-19 negative.   CT of the abdomen and pelvis showed partial small bowel obstruction with transition point in the right abdomen, suspicion for adhesions.  Right nephrolithiasis, no hydronephrosis.    EKG 87 bpm, rightward axis, first-degree AV block, sinus rhythm, no significant ST segment or T wave changes.  Patient had NG tube place for decompression with good toleration. Radiographic and clinical follow up showed improvement in bowel obstruction. NG was removed and diet was advanced with good toleration.  Patient will follow  up as outpatient.   Partial small bowel obstruction.  Patient was admitted to the medical ward, he received supportive medical therapy with intravenous fluids, as needed analgesics and antiemetics. He required a nasogastric tube to low intermittent suction for decompression.  He had radiographic and clinical follow-up, showing improvement of his symptoms. Nasogastric tube was removed and his diet was advanced. Currently tolerating well full liquid diet. Plan to continue advance diet at home, slowly, staying on a full liquid, soft diet for several days.  2.  Type 2 diabetes mellitus.  Patient received insulin sliding scale for glucose coverage and monitoring. His glucose remained well controlled and insulin was discontinued. At home patient will continue on his oral antihyperglycemic agents. Empagliflozin, metformin, glimperide, semaglutide.  Follow-up as an outpatient.  3.  Hypertension.  His blood pressure remained well controlled during his hospitalization.  At discharge he will resume amlodipine and olmesartan.   4.  Obesity class II.  His calculated BMI is 37.6, will need close follow-up as an outpatient and aggressive lifestyle modifications.    Discharge Diagnoses:  Principal Problem:   SBO (small bowel obstruction) (HCC) Active Problems:   Diabetes mellitus ([place])   Hypercholesterolemia   Morbid obesity due to excess calories ([place])   Shift work sleep disorder   Essential hypertension    Discharge Instructions   Allergies as of 10/19/2020   Not on File      Medication List     STOP taking these medications    cyclobenzaprine 10 MG tablet Commonly known as: FLEXERIL   naproxen 500 MG tablet Commonly known as: NAPROSYN       TAKE these medications    amLODipine-olmesartan 5-40 MG  tablet Commonly known as: AZOR Take 1 tablet by mouth daily. Reported on 09/01/2015   Empagliflozin-metFORMIN HCl 12.5-500 MG Tabs Take 2 tablets by mouth every morning.    glimepiride 4 MG tablet Commonly known as: AMARYL Take 4 mg by mouth daily with breakfast.   meloxicam 15 MG tablet Commonly known as: MOBIC Take 15 mg by mouth daily. Reported on 09/01/2015   Semaglutide(0.25 or 0.'5MG'$ /DOS) 2 MG/1.5ML Sopn Inject 0.5 mg into the skin every Tuesday.        Not on File  Consultations: Surgery    Procedures/Studies: DG Abd 1 View  Result Date: 10/18/2020 CLINICAL DATA:  Small bowel obstruction. EXAM: ABDOMEN - 1 VIEW COMPARISON:  October 17, 2020. FINDINGS: The bowel gas pattern is normal. Residual contrast is noted throughout the colon and in the stomach. Nasogastric tube tip is seen in proximal stomach. IMPRESSION: Nasogastric tube tip seen in proximal stomach. No abnormal bowel dilatation is noted. Residual contrast is noted in the colon. Electronically Signed   By: Marijo Conception M.D.   On: 10/18/2020 08:36   CT ABDOMEN PELVIS W CONTRAST  Result Date: 10/17/2020 CLINICAL DATA:  Acute abdominal pain.  Diarrhea. EXAM: CT ABDOMEN AND PELVIS WITH CONTRAST TECHNIQUE: Multidetector CT imaging of the abdomen and pelvis was performed using the standard protocol following bolus administration of intravenous contrast. CONTRAST:  118m OMNIPAQUE IOHEXOL 350 MG/ML SOLN COMPARISON:  None. FINDINGS: Lower Chest: No acute findings. Hepatobiliary: No hepatic masses identified. Gallbladder is unremarkable. No evidence of biliary ductal dilatation. Pancreas:  No mass or inflammatory changes. Spleen: Within normal limits in size and appearance. Adrenals/Urinary Tract: No masses identified. A 6 mm calculus is seen in the interpolar region of the right kidney. No evidence of ureteral calculi or hydronephrosis. Unremarkable unopacified urinary bladder. Stomach/Bowel: Moderately dilated small bowel loops are seen in the abdomen and. A transition point is seen in the right abdomen, suspicious for adhesion. No soft tissue mass or inflammatory process identified. No abnormal fluid  collections are seen. Vascular/Lymphatic: No pathologically enlarged lymph nodes. No acute vascular findings. Reproductive:  No mass or other significant abnormality. Other:  None. Musculoskeletal: No suspicious bone lesions identified. Right hip prosthesis again noted. IMPRESSION: Partial small bowel obstruction, with transition point in right abdomen, suspicious for adhesion. Right nephrolithiasis. No evidence of ureteral calculi or hydronephrosis. Electronically Signed   By: JMarlaine HindM.D.   On: 10/17/2020 15:11   DG Abd Portable 1V  Result Date: 10/19/2020 CLINICAL DATA:  Small-bowel obstruction. EXAM: PORTABLE ABDOMEN - 1 VIEW COMPARISON:  10/18/2020.  CT 10/17/2020. FINDINGS: Upper abdomen not imaged. NG tube not visualized. Multiple dilated loops of small bowel again noted. Oral contrast in the colon. Colon is nondistended. Findings consistent with partial small bowel obstruction. No free air visualized. Degenerative change lumbar spine. Right hip replacement. IMPRESSION: 1.  Upper abdomen not imaged.  NG tube not visualized. 2. Multiple dilated loops of small bowel again noted. Oral contrast in the colon. Colon is nondistended. Findings consistent with partial small bowel obstruction. Electronically Signed   By: TMarcello Moores Register   On: 10/19/2020 06:38   DG Abd Portable 1V-Small Bowel Protocol-Position Verification  Result Date: 10/17/2020 CLINICAL DATA:  NG tube placement EXAM: PORTABLE ABDOMEN - 1 VIEW COMPARISON:  10/17/2020 at 1614 hours FINDINGS: Enteric tube terminates in the proximal gastric body. IMPRESSION: Enteric tube terminates in the proximal gastric body. Electronically Signed   By: SJulian HyM.D.   On: 10/17/2020 19:17  DG Abd Portable 1V-Small Bowel Obstruction Protocol-initial, 8 hr delay  Result Date: 10/17/2020 CLINICAL DATA:  NG tube placement.  Small-bowel obstruction. EXAM: PORTABLE ABDOMEN - 1 VIEW COMPARISON:  CT earlier today. FINDINGS: Tip of the enteric tube  is below the diaphragm in the stomach, the side port is in the region of the gastroesophageal junction. Recommend advancement of at least 5 cm for optimal placement. Gaseous gastric distension is well as prominent small bowel corresponding to obstruction on prior CT. Right nephrolithiasis on CT not well seen on the current exam. IMPRESSION: 1. Tip of the enteric tube below the diaphragm in the stomach, side-port in the region of the gastroesophageal junction. Recommend advancement of at least 5 cm for optimal placement. 2. Small bowel obstruction as seen on CT. Electronically Signed   By: Keith Rake M.D.   On: 10/17/2020 16:30   US Abdomen Limited RUQ (LIVER/GB)  Result Date: 10/17/2020 CLINICAL DATA:  Right upper quadrant abdominal pain for 2 days. EXAM: ULTRASOUND ABDOMEN LIMITED RIGHT UPPER QUADRANT COMPARISON:  None. FINDINGS: Gallbladder: No gallstones or wall thickening visualized. No sonographic Murphy sign noted by sonographer. Common bile duct: Diameter: 4.2 mm Liver: There is diffuse increased echogenicity of the liver and decreased through transmission consistent with fatty infiltration. No focal lesions or biliary dilatation. Portal vein is patent on color Doppler imaging with normal direction of blood flow towards the liver. Other: None. IMPRESSION: 1. Normal gallbladder and normal caliber common bile duct. 2. Fatty infiltration of the liver but no hepatic lesions. Electronically Signed   By: Marijo Sanes M.D.   On: 10/17/2020 13:07       Subjective: Patient is feeling better, no nausea or vomiting and tolerating well liquid diet.   Discharge Exam: Vitals:   10/18/20 2018 10/19/20 0441  BP: (!) 139/97 124/83  Pulse: 87 78  Resp: 18 18  Temp: 98.5 F (36.9 C) 98 F (36.7 C)  SpO2: 99% 98%   Vitals:   10/18/20 0528 10/18/20 1349 10/18/20 2018 10/19/20 0441  BP: 131/88 (!) 144/84 (!) 139/97 124/83  Pulse: 94 84 87 78  Resp: '20 16 18 18  '$ Temp: 98 F (36.7 C) 98.6 F (37  C) 98.5 F (36.9 C) 98 F (36.7 C)  TempSrc: Oral Oral Oral Oral  SpO2: 97% 97% 99% 98%  Weight:      Height:        General: Not in pain or dyspnea.  Neurology: Awake and alert, non focal  E ENT: no pallor, no icterus, oral mucosa moist Cardiovascular: No JVD. S1-S2 present, rhythmic, no gallops, rubs, or murmurs. No lower extremity edema. Pulmonary: positive breath sounds bilaterally, adequate air movement, no wheezing, rhonchi or rales. Gastrointestinal. Abdomen protuberant with mild distention, non tender  Skin. No rashes Musculoskeletal: no joint deformities   The results of significant diagnostics from this hospitalization (including imaging, microbiology, ancillary and laboratory) are listed below for reference.     Microbiology: Recent Results (from the past 240 hour(s))  Resp Panel by RT-PCR (Flu A&B, Covid) Nasopharyngeal Swab     Status: None   Collection Time: 10/17/20  4:01 PM   Specimen: Nasopharyngeal Swab; Nasopharyngeal(NP) swabs in vial transport medium  Result Value Ref Range Status   SARS Coronavirus 2 by RT PCR NEGATIVE NEGATIVE Final    Comment: (NOTE) SARS-CoV-2 target nucleic acids are NOT DETECTED.  The SARS-CoV-2 RNA is generally detectable in upper respiratory specimens during the acute phase of infection. The lowest concentration of SARS-CoV-2  viral copies this assay can detect is 138 copies/mL. A negative result does not preclude SARS-Cov-2 infection and should not be used as the sole basis for treatment or other patient management decisions. A negative result may occur with  improper specimen collection/handling, submission of specimen other than nasopharyngeal swab, presence of viral mutation(s) within the areas targeted by this assay, and inadequate number of viral copies(<138 copies/mL). A negative result must be combined with clinical observations, patient history, and epidemiological information. The expected result is Negative.  Fact  Sheet for Patients:  EntrepreneurPulse.com.au  Fact Sheet for Healthcare Providers:  IncredibleEmployment.be  This test is no t yet approved or cleared by the [place] FDA and  has been authorized for detection and/or diagnosis of SARS-CoV-2 by FDA under an Emergency Use Authorization (EUA). This EUA will remain  in effect (meaning this test can be used) for the duration of the COVID-19 declaration under Section 564(b)(1) of the Act, 21 U.S.C.section 360bbb-3(b)(1), unless the authorization is terminated  or revoked sooner.       Influenza A by PCR NEGATIVE NEGATIVE Final   Influenza B by PCR NEGATIVE NEGATIVE Final    Comment: (NOTE) The Xpert Xpress SARS-CoV-2/FLU/RSV plus assay is intended as an aid in the diagnosis of influenza from Nasopharyngeal swab specimens and should not be used as a sole basis for treatment. Nasal washings and aspirates are unacceptable for Xpert Xpress SARS-CoV-2/FLU/RSV testing.  Fact Sheet for Patients: EntrepreneurPulse.com.au  Fact Sheet for Healthcare Providers: IncredibleEmployment.be  This test is not yet approved or cleared by the [place] FDA and has been authorized for detection and/or diagnosis of SARS-CoV-2 by FDA under an Emergency Use Authorization (EUA). This EUA will remain in effect (meaning this test can be used) for the duration of the COVID-19 declaration under Section 564(b)(1) of the Act, 21 U.S.C. section 360bbb-3(b)(1), unless the authorization is terminated or revoked.  Performed at [facility], [address], [place], [place] [address]      Labs: BNP (last 3 results) No results for input(s): BNP in the last 8760 hours. Basic Metabolic Panel: Recent Labs  Lab 10/17/20 1019 10/17/20 1950 10/18/20 0424 10/19/20 0535  NA 136  --  139 136  K 4.2  --  3.9 3.8  CL 102  --  102 101  CO2 23  --  24 25  GLUCOSE  113*  --  144* 126*  BUN 13  --  16 12  CREATININE 0.73 0.84 0.78 0.67  CALCIUM 9.6  --  9.1 9.1   Liver Function Tests: Recent Labs  Lab 10/17/20 1019 10/18/20 0424  AST 19 15  ALT 24 22  ALKPHOS 89 73  BILITOT 0.9 1.0  PROT 8.2* 7.3  ALBUMIN 4.3 3.7   Recent Labs  Lab 10/17/20 1019  LIPASE 15   No results for input(s): AMMONIA in the last 168 hours. CBC: Recent Labs  Lab 10/17/20 1019 10/17/20 1950 10/18/20 0424  WBC 6.7 6.2 4.7  HGB 16.9 16.9 16.5  HCT 53.8* 55.9* 54.2*  MCV 78.0* 80.1 80.2  PLT 268 304 267   Cardiac Enzymes: No results for input(s): CKTOTAL, CKMB, CKMBINDEX, TROPONINI in the last 168 hours. BNP: Invalid input(s): POCBNP CBG: Recent Labs  Lab 10/17/20 2152 10/18/20 0738 10/18/20 1121  GLUCAP 97 135* 112*   D-Dimer No results for input(s): DDIMER in the last 72 hours. Hgb A1c Recent Labs    10/17/20 1950  HGBA1C 6.9*   Lipid Profile No results  for input(s): CHOL, HDL, LDLCALC, TRIG, CHOLHDL, LDLDIRECT in the last 72 hours. Thyroid function studies No results for input(s): TSH, T4TOTAL, T3FREE, THYROIDAB in the last 72 hours.  Invalid input(s): FREET3 Anemia work up No results for input(s): VITAMINB12, FOLATE, FERRITIN, TIBC, IRON, RETICCTPCT in the last 72 hours. Urinalysis No results found for: COLORURINE, APPEARANCEUR, [place], [place], GLUCOSEU, [place], [place], [place], PROTEINUR, UROBILINOGEN, NITRITE, LEUKOCYTESUR Sepsis Labs Invalid input(s): PROCALCITONIN,  WBC,  LACTICIDVEN Microbiology Recent Results (from the past 240 hour(s))  Resp Panel by RT-PCR (Flu A&B, Covid) Nasopharyngeal Swab     Status: None   Collection Time: 10/17/20  4:01 PM   Specimen: Nasopharyngeal Swab; Nasopharyngeal(NP) swabs in vial transport medium  Result Value Ref Range Status   SARS Coronavirus 2 by RT PCR NEGATIVE NEGATIVE Final    Comment: (NOTE) SARS-CoV-2 target nucleic acids are NOT DETECTED.  The SARS-CoV-2 RNA is generally  detectable in upper respiratory specimens during the acute phase of infection. The lowest concentration of SARS-CoV-2 viral copies this assay can detect is 138 copies/mL. A negative result does not preclude SARS-Cov-2 infection and should not be used as the sole basis for treatment or other patient management decisions. A negative result may occur with  improper specimen collection/handling, submission of specimen other than nasopharyngeal swab, presence of viral mutation(s) within the areas targeted by this assay, and inadequate number of viral copies(<138 copies/mL). A negative result must be combined with clinical observations, patient history, and epidemiological information. The expected result is Negative.  Fact Sheet for Patients:  EntrepreneurPulse.com.au  Fact Sheet for Healthcare Providers:  IncredibleEmployment.be  This test is no t yet approved or cleared by the [place] FDA and  has been authorized for detection and/or diagnosis of SARS-CoV-2 by FDA under an Emergency Use Authorization (EUA). This EUA will remain  in effect (meaning this test can be used) for the duration of the COVID-19 declaration under Section 564(b)(1) of the Act, 21 U.S.C.section 360bbb-3(b)(1), unless the authorization is terminated  or revoked sooner.       Influenza A by PCR NEGATIVE NEGATIVE Final   Influenza B by PCR NEGATIVE NEGATIVE Final    Comment: (NOTE) The Xpert Xpress SARS-CoV-2/FLU/RSV plus assay is intended as an aid in the diagnosis of influenza from Nasopharyngeal swab specimens and should not be used as a sole basis for treatment. Nasal washings and aspirates are unacceptable for Xpert Xpress SARS-CoV-2/FLU/RSV testing.  Fact Sheet for Patients: EntrepreneurPulse.com.au  Fact Sheet for Healthcare Providers: IncredibleEmployment.be  This test is not yet approved or cleared by the [place] FDA  and has been authorized for detection and/or diagnosis of SARS-CoV-2 by FDA under an Emergency Use Authorization (EUA). This EUA will remain in effect (meaning this test can be used) for the duration of the COVID-19 declaration under Section 564(b)(1) of the Act, 21 U.S.C. section 360bbb-3(b)(1), unless the authorization is terminated or revoked.  Performed at [facility], [address], [place], [place] [address]      Time coordinating discharge: 45 minutes  SIGNED:   Tawni Millers, MD  [facility] 10/19/2020, 10:31 AM

## 2020-10-19 NOTE — Progress Notes (Signed)
    CC: Abdominal pain  Subjective: Patient feels much better this a.m.  He is tolerating clear liquids.  He says he had a bowel movement with some liquid and a little bit of formed stool in it.  Objective: Vital signs in last 24 hours: Temp:  [98 F (36.7 C)-98.6 F (37 C)] 98 F (36.7 C) (07/21 0441) Pulse Rate:  [78-87] 78 (07/21 0441) Resp:  [16-18] 18 (07/21 0441) BP: (124-144)/(83-97) 124/83 (07/21 0441) SpO2:  [97 %-99 %] 98 % (07/21 0441) Last BM Date: 10/16/20 878 IV recorded No p.o. recorded 450 urine recorded No other output recorded Afebrile vital signs are stable blood pressure was up a little bit yesterday. Potassium is 3.8 this morning. A.m. film shows multiple dilated loops of small bowel oral contrast in the colon colon is nondistended there is still dilated loops of small bowel again noted. Intake/Output from previous day: 07/20 0701 - 07/21 0700 In: 878.2 [I.V.:778.2; IV Piggyback:100] Out: 450 [Urine:450] Intake/Output this shift: No intake/output data recorded.  General appearance: alert, cooperative, and no distress Resp: clear to auscultation bilaterally GI: Large abdomen he does not feel distended.  He is tolerating clears and having bowel movements.  This is his first episode of bowel obstruction with no prior surgical history.  Lab Results:  Recent Labs    10/17/20 1950 10/18/20 0424  WBC 6.2 4.7  HGB 16.9 16.5  HCT 55.9* 54.2*  PLT 304 267    BMET Recent Labs    10/18/20 0424 10/19/20 0535  NA 139 136  K 3.9 3.8  CL 102 101  CO2 24 25  GLUCOSE 144* 126*  BUN 16 12  CREATININE 0.78 0.67  CALCIUM 9.1 9.1   PT/INR No results for input(s): LABPROT, INR in the last 72 hours.  Recent Labs  Lab 10/17/20 1019 10/18/20 0424  AST 19 15  ALT 24 22  ALKPHOS 89 73  BILITOT 0.9 1.0  PROT 8.2* 7.3  ALBUMIN 4.3 3.7     Lipase     Component Value Date/Time   LIPASE 15 10/17/2020 1019     Medications:  enoxaparin (LOVENOX)  injection  60 mg Subcutaneous Q24H    Assessment/Plan   SBO/no prior abdominal surgery  -Patient had an NG placed and drained over 3 L last evening.  Film yesterday shows some residual contrast in the stomach and contrast throughout the colon.   - A.m. film today shows some small bowel dilatation, with contrast throughout the colon.  Plan: His NG came out yesterday he is tolerating clears had a bowel movement.  We will put him on full liquids and if he tolerates this he can go home later today.  I recommended he stay on a full liquid/soft diet for several days.  I recommended he follow-up with his primary care physician who manages his diabetes.  Please call if we can be of further assistance.   FEN:  NPO/IV fluids ID: None DVT: Lovenox   Type 2 diabetes Hypertension Obstructive sleep apnea -CPAP Morbid obesity BMI 37.67 Osteoarthritis S/P right hip replacement 2015 Hx CAD -NM myocardial scan 2015 -small anterior septal infarct, no reversible ischemia, global hypokinesis, EF 33%  -Cardiac cath 08/16/2012 -results not released     LOS: 2 days    Irmalee Riemenschneider 10/19/2020 Please see Amion

## 2020-10-19 NOTE — Progress Notes (Signed)
MD order to discharge after advancement and toleration of full liquid diet.  Diet called into cafeteria and set for 1230. Discussed/Reviewed discharge instructions with pt and his wife.   Both verbalize understanding of discharge instructions.

## 2020-10-20 ENCOUNTER — Encounter: Payer: Self-pay | Admitting: Gastroenterology

## 2020-11-17 ENCOUNTER — Other Ambulatory Visit (INDEPENDENT_AMBULATORY_CARE_PROVIDER_SITE_OTHER): Payer: BLUE CROSS/BLUE SHIELD

## 2020-11-17 ENCOUNTER — Ambulatory Visit (INDEPENDENT_AMBULATORY_CARE_PROVIDER_SITE_OTHER): Payer: BLUE CROSS/BLUE SHIELD | Admitting: Gastroenterology

## 2020-11-17 ENCOUNTER — Encounter: Payer: Self-pay | Admitting: Gastroenterology

## 2020-11-17 VITALS — BP 142/100 | HR 76 | Ht 73.5 in | Wt 293.0 lb

## 2020-11-17 DIAGNOSIS — R718 Other abnormality of red blood cells: Secondary | ICD-10-CM | POA: Diagnosis not present

## 2020-11-17 DIAGNOSIS — K760 Fatty (change of) liver, not elsewhere classified: Secondary | ICD-10-CM

## 2020-11-17 DIAGNOSIS — Z1211 Encounter for screening for malignant neoplasm of colon: Secondary | ICD-10-CM

## 2020-11-17 DIAGNOSIS — K56609 Unspecified intestinal obstruction, unspecified as to partial versus complete obstruction: Secondary | ICD-10-CM

## 2020-11-17 DIAGNOSIS — Z8679 Personal history of other diseases of the circulatory system: Secondary | ICD-10-CM

## 2020-11-17 LAB — IBC + FERRITIN
Ferritin: 177.2 ng/mL (ref 22.0–322.0)
Iron: 94 ug/dL (ref 42–165)
Saturation Ratios: 27.7 % (ref 20.0–50.0)
TIBC: 338.8 ug/dL (ref 250.0–450.0)
Transferrin: 242 mg/dL (ref 212.0–360.0)

## 2020-11-17 NOTE — Patient Instructions (Addendum)
If you are age 48 or older, your body mass index should be between 23-30. Your Body mass index is 38.13 kg/m. If this is out of the aforementioned range listed, please consider follow up with your Primary Care Provider.  If you are age 56 or younger, your body mass index should be between 19-25. Your Body mass index is 38.13 kg/m. If this is out of the aformentioned range listed, please consider follow up with your Primary Care Provider.   __________________________________________________________  The [facility] providers would like to encourage you to use [facility] to communicate with providers for non-urgent requests or questions.  Due to long hold times on the telephone, sending your provider a message by [facility] may be a faster and more efficient way to get a response.  Please allow 48 business hours for a response.  Please remember that this is for non-urgent requests.    You have been scheduled for an MRI at [facility] on Saturday, 11-25-20 at 12:00 pm. Please arrive at  10:30 am to admitting (at main entrance of the hospital) to drink contrast needed for the exam. Please make certain not to have anything to eat or drink 6 hours prior to your test. In addition, if you have any metal in your body, have a pacemaker or defibrillator, please be sure to let your ordering physician know. This test typically takes 45 minutes to 1 hour to complete. Should you need to reschedule, please call [phone] to do so.  Please go to the lab in the basement of our building to have lab work done as you leave today. Hit "B" for basement when you get on the elevator.  When the doors open the lab is on your left.  We will call you with the results. Thank you.  Please discontinue Meloxicam. Use Tylenol as needed.  We are giving you a handout on Low Residue Diet.  We will request your records from Dr. Dixie Dials.  We will decide the timing of your colonoscopy after reviewing those records.  Thank  you for entrusting me with your care and for choosing [facility], Dr. Franklin Cellar

## 2020-11-17 NOTE — Progress Notes (Signed)
HPI :  50 year old male with history of diabetes, CHF, sleep apnea, referred here following recent hospitalization for partial small bowel obstruction.  Patient was admitted to the hospital on July 20.  He presented with acute onset abdominal pain and abdominal distention.  He states the pain was diffuse all over his lower abdomen.  Denied any nausea and vomiting at the time.  He had a CT scan done in the ER showing possible partial small bowel obstruction with a transition point in the right lower quadrant.  He was admitted and followed by the surgical service.  They placed an NG tube for decompression.  He improved over the course of 48 hours, x-ray showed contrast entering the colon.  He states this is the first time he ever had bowel obstruction before.  He has had no prior abdominal surgeries.  Since that hospitalization he states he had 1 recurrence of pain on July 30 where he had nausea and vomiting and abdominal pain, although had some loose stools with that as well.  He states it resolved on its own days generally done well without complaints.  He has had some very mild discomfort in his abdomen periodically since then but nothing like he had before.  He states he is "eating late", avoiding greasy foods.  He has had no nausea or vomiting.  He is moving his bowels to 3 times per day.  No blood in his stools.  He does take meloxicam once daily for knee pains.  Since he has changed his diet and gone through this episode he has lost about 10 pounds over the past month.  Incidentally noted on ultrasound during this admission he had fatty liver.  He denies any routine alcohol use.  His liver enzymes have remained normal.  He was unaware of this finding.  He has a history of an abnormal nuclear stress test in 2015 with a small fixed defects in the anterior septal wall with no evidence of reversible ischemia.  He had global hypokinesia and EF of 33% at that time.  He states he is followed with Dr.  Doylene Canard of cardiology and had an ultrasound of his heart since then but does not recall when this was.  No record in his chart available for this.  He states his diabetes is better controlled with an A1c of 6.9 recently.  He has never had any prior colon cancer screening.  No prior colonoscopy.  He has no family history of colon cancer.   CT scan abdomen pelvis October 17, 2020: IMPRESSION: Partial small bowel obstruction, with transition point in right abdomen, suspicious for adhesion. Right nephrolithiasis. No evidence of ureteral calculi or hydronephrosis.  Right upper quadrant ultrasound 10/17/2020: IMPRESSION: 1. Normal gallbladder and normal caliber common bile duct. 2. Fatty infiltration of the liver but no hepatic lesions.   Past Medical History:  Diagnosis Date   Arthritis    in his hip   Chest pain 08/14/2012   Diabetes mellitus without complication ([place])    TYPE 2   Diabetes mellitus without complication ([place])    DOE (dyspnea on exertion)    whenever he is working hard   Gout    left fingers   Hypertension    Sleep apnea      Past Surgical History:  Procedure Laterality Date   CARDIAC CATHETERIZATION     2014   HIP PINNING Right 1985   LEFT HEART CATHETERIZATION WITH CORONARY ANGIOGRAM N/A 08/17/2012   Procedure: LEFT HEART CATHETERIZATION  WITH CORONARY ANGIOGRAM;  Surgeon: Birdie Riddle, MD;  Location: [facility] CATH LAB;  Service: Cardiovascular;  Laterality: N/A;   TOTAL HIP ARTHROPLASTY Right 03/28/2014   Procedure: RIGHT TOTAL HIP ARTHROPLASTY;  Surgeon: Kerin Salen, MD;  Location: [facility];  Service: Orthopedics;  Laterality: Right;   TOTAL HIP ARTHROPLASTY     Right   Family History  Problem Relation Age of Onset   Cancer Mother    Hypertension Mother    Social History   Tobacco Use   Smoking status: Never   Smokeless tobacco: Never  Substance Use Topics   Alcohol use: No   Drug use: No   Current Outpatient Medications  Medication Sig Dispense Refill    amLODipine-olmesartan (AZOR) 5-40 MG tablet Take 1 tablet by mouth daily. Reported on 09/01/2015     Empagliflozin-metFORMIN HCl 12.5-500 MG TABS Take 2 tablets by mouth every morning.      glimepiride (AMARYL) 4 MG tablet Take 4 mg by mouth daily with breakfast.     meloxicam (MOBIC) 15 MG tablet Take 15 mg by mouth daily. Reported on 09/01/2015     Semaglutide,0.25 or 0.'5MG'$ /DOS, 2 MG/1.5ML SOPN Inject 0.5 mg into the skin every Tuesday.     traMADol (ULTRAM) 50 MG tablet Take 50 mg by mouth daily as needed.     No current facility-administered medications for this visit.   Not on File   Review of Systems: All systems reviewed and negative except where noted in HPI.   Lab Results  Component Value Date   WBC 4.7 10/18/2020   HGB 16.5 10/18/2020   HCT 54.2 (H) 10/18/2020   MCV 80.2 10/18/2020   PLT 267 10/18/2020    Lab Results  Component Value Date   CREATININE 0.67 10/19/2020   BUN 12 10/19/2020   NA 136 10/19/2020   K 3.8 10/19/2020   CL 101 10/19/2020   CO2 25 10/19/2020    Lab Results  Component Value Date   ALT 22 10/18/2020   AST 15 10/18/2020   ALKPHOS 73 10/18/2020   BILITOT 1.0 10/18/2020     DG Abd Portable 1V  Result Date: 10/19/2020 CLINICAL DATA:  Small-bowel obstruction. EXAM: PORTABLE ABDOMEN - 1 VIEW COMPARISON:  10/18/2020.  CT 10/17/2020. FINDINGS: Upper abdomen not imaged. NG tube not visualized. Multiple dilated loops of small bowel again noted. Oral contrast in the colon. Colon is nondistended. Findings consistent with partial small bowel obstruction. No free air visualized. Degenerative change lumbar spine. Right hip replacement. IMPRESSION: 1.  Upper abdomen not imaged.  NG tube not visualized. 2. Multiple dilated loops of small bowel again noted. Oral contrast in the colon. Colon is nondistended. Findings consistent with partial small bowel obstruction. Electronically Signed   By: Marcello Moores  Register   On: 10/19/2020 06:38    Physical Exam: Ht 6'  1.5" (1.867 m) Comment: height measured without shoes  Wt 293 lb (132.9 kg)   BMI 38.13 kg/m  Constitutional: Pleasant,well-developed, male in no acute distress. Cardiovascular: Normal rate, regular rhythm.  Pulmonary/chest: Effort normal and breath sounds normal.  Abdominal: Soft, nondistended, nontender. There are no masses palpable.  Extremities: no edema Lymphadenopathy: No cervical adenopathy noted. Neurological: Alert and oriented to person place and time. Skin: Skin is warm and dry. No rashes noted. Psychiatric: Normal mood and affect. Behavior is normal.   ASSESSMENT AND PLAN: 50 year old male here for reassessment of the following:  Small bowel obstruction Microcytosis Fatty liver Colon cancer screening History of CHF  Discussed the patient's recent hospital course, clearly had a small bowel obstruction which resolved.  Sounds like he had some recurrent symptoms about a week later which resolved on its own and since then has been very cautious with his diet.  He was told he may have adhesive disease causing this however he has never had a prior abdominal surgery.  While adhesive disease remains possible I do think he warrants follow-up imaging to make sure nothing else going on.  In this light I recommend a follow-up MR enterography to reevaluate his bowel, he is agreeable with this.  He does take routine NSAIDs and ulceration bowels possible, recommend he stop all NSAIDs, he can use Tylenol for pain  We discussed microcytosis noted on his labs, going on for iron deficiency with labs today.  He has never had a prior colonoscopy and is overdue for routine colon cancer screening.  I am recommending an optical colonoscopy and will attempt ileal intubation at that time, I discussed risk benefits of this and he wants to proceed.  However, in light of his history of CHF I do need to get his records from his cardiologist with his last echocardiogram to show that his ejection fraction has  improved from when last checked.  He states he is asymptomatic from a cardiopulmonary standpoint at this time.  We will await his cardiology records prior to scheduling the colonoscopy.  Otherwise we discussed the incidental noting of fatty liver on his ultrasound and I discussed what that is with him.  He does not drink much alcohol, his diabetes is better controlled.  He would benefit from some weight loss over time, we will keep an eye on his fatty liver.  Liver enzymes are normal at this time, but he is at high risk for fibrotic change and cirrhosis with these findings.  Plan: - we will coordinate follow-up MR enterography - TIBC/ferritin panel to screen for iron deficiency - we will obtain records from his cardiologist to clarify findings of last echocardiogram.  Once we have this we can coordinate his colonoscopy assuming his ejection fraction is improved since when last checked - told him to avoid meloxicam/routine use of NSAIDs at this time.  He will use Tylenol as needed for pain - recommended low residual diet, handout provided to him if he is having mild symptoms periodically - our staff showed him how to sign up for MyChart account so he can get the results of his studies quickly  Patient and wife agreed with the plan, all questions answered.  Further recommendations pending the results of his course.   Jolly Mango, MD [facility]

## 2020-11-25 ENCOUNTER — Ambulatory Visit (HOSPITAL_COMMUNITY)
Admission: RE | Admit: 2020-11-25 | Discharge: 2020-11-25 | Disposition: A | Discharge status: Home / Self Care | Payer: BLUE CROSS/BLUE SHIELD | Source: Ambulatory Visit | Attending: Gastroenterology | Admitting: Gastroenterology

## 2020-11-25 ENCOUNTER — Other Ambulatory Visit: Payer: Self-pay

## 2020-11-25 DIAGNOSIS — K56609 Unspecified intestinal obstruction, unspecified as to partial versus complete obstruction: Secondary | ICD-10-CM

## 2020-11-25 DIAGNOSIS — Z8679 Personal history of other diseases of the circulatory system: Secondary | ICD-10-CM

## 2020-11-25 DIAGNOSIS — K760 Fatty (change of) liver, not elsewhere classified: Secondary | ICD-10-CM

## 2020-11-25 DIAGNOSIS — Z1211 Encounter for screening for malignant neoplasm of colon: Secondary | ICD-10-CM

## 2020-11-25 DIAGNOSIS — R718 Other abnormality of red blood cells: Secondary | ICD-10-CM

## 2020-11-25 MED ORDER — GADOBUTROL 1 MMOL/ML IV SOLN
10.0000 mL | Freq: Once | INTRAVENOUS | Status: AC | PRN
  Administered 2020-11-25: 10 mL via INTRAVENOUS

## 2020-11-30 ENCOUNTER — Telehealth: Payer: Self-pay

## 2020-11-30 NOTE — Telephone Encounter (Signed)
Spoke with Venetia Night at Dr. Merrilee Jansky office, I have asked her to reach out to Dr. Doylene Canard in regards to possibly ordering an echo for the patient to see if his EF has improved at all since 2015. Advised that we will need this information prior to being able to get him scheduled for his colonoscopy. I have provided Venetia Night with the office number and Dr. Doyne Keel office number as well if Dr. Doylene Canard would like to speak with him directly. Venetia Night will give me a call back with an update. She states that they will probably go ahead and order one but she will confirm with Dr. Doylene Canard. I have asked Venetia Night to let us know if they do divide to schedule patient for an echo. Will await return call.

## 2020-11-30 NOTE — Telephone Encounter (Signed)
-----   Message from Yetta Flock, MD sent at 11/29/2020  6:13 PM EDT ----- Thanks Cristal Howatt. I reviewed his records and Dr. Doylene Canard thinks he is going well but I don't see that he's had a repeat echo since 2015 when his EF was 33%. Can you check with their office if he has had an echo since that time? If not I think it would be quite useful to determine this is improved over time which will influence his anesthesia risk for a colonoscopy, can you ask if they would consider this or if we need to order it? thanks

## 2020-12-07 NOTE — Telephone Encounter (Signed)
Left message on vm for Dr. Merrilee Jansky office to return call to see if Echo has been recommended or ordered.

## 2020-12-26 NOTE — Telephone Encounter (Signed)
Spoke with Venetia Night at Dr. Merrilee Jansky office, she states that she spoke with the Doctor and they have tried to contact patient with no success. Venetia Night states that she will try to contact patient again because Dr. Doylene Canard may need an OV prior to ordering an echo. I have asked Venetia Night to contact the office with any information via phone or fax. I have provided her with the fax number.

## 2021-01-08 NOTE — Telephone Encounter (Signed)
Dr. Havery Moros, I have tried contacting Dr. Merrilee Jansky office multiple times and have not received any information. Not sure how you would like to proceed.

## 2021-01-08 NOTE — Telephone Encounter (Signed)
Thanks [employer]. I just reached out to him directly and will hope I hear back. Let's circle back in a week or so if not, otherwise I'll let you know when I hear. Thank you

## 2021-01-11 NOTE — Telephone Encounter (Signed)
Lm on vm for patient to return call 

## 2021-01-11 NOTE — Telephone Encounter (Signed)
Yetta Flock, MD  Yevette Edwards, RN [employer] I head back from Dr. Doylene Canard, he was out of town. He will contact him to set up an echocardiogram. Can you reach out to the patient to let him know to contact their office and also contact us once that is done so we can review and schedule him accordingly. Thanks!        Previous Messages   ----- Message -----  From: Dixie Dials, MD  Sent: 01/11/2021  11:17 AM EDT  To: Yetta Flock, MD  Subject: RE: mutual patient                             I was out of town. Will call him to get echo done.  You can remind him also to contact our office as post COVID some patients don't keep appointment  ----- Message -----  From: Yetta Flock, MD  Sent: 01/08/2021   5:26 PM EDT  To: Dixie Dials, MD  Subject: mutual patient                                 Hi Dr. Doylene Canard,   I wanted to touch base about this mutual patient. I saw him in August and he is in need of a colonoscopy but has not had an echo in some time, last EF was not too good. I was wondering if you would be willing to repeat an echocardiogram for him so we can determine if the EF has improved to where we can proceed with this as an outpatient, as if not he would need to be done in the hospital which will increase the cost for him. Our staff tried reaching out to your office a few times but have not heard back anything. Thanks for your help.   Jerry Hart

## 2021-01-15 NOTE — Telephone Encounter (Signed)
Lm on vm for patient to return call 

## 2021-01-17 NOTE — Telephone Encounter (Signed)
Attempted to reach patient, his phone goes straight to vm. His vm is full and cannot accept any new messages at this time. Unable to leave a vm.

## 2021-01-18 NOTE — Telephone Encounter (Signed)
Letter mailed to patient.

## 2021-05-22 ENCOUNTER — Telehealth: Payer: Self-pay | Admitting: Gastroenterology

## 2021-05-22 NOTE — Telephone Encounter (Signed)
Inbound call from patient wife. States [place] are [place] and patient is starting to experience diarrhea, bloating and dizziness. They would like a call back with recommendations on treatments. Best contact number [phone]

## 2021-05-22 NOTE — Telephone Encounter (Signed)
Returned call to patient's wife, pt is present for the conversation. She reports that pt developed diarrhea yesterday and he started having diarrhea yesterday and indigestion around 3 am today. Pt reports that he has had 5 episodes of small volume diarrhea. He denies a new diet, new medications, no blood in the stool, no abdominal pian. He states that he may have a slight fever, wife states that he is warm to the touch, no chills. He reports some nausea. I told them that it is hard to determine what is causing his symptoms. I told them that since he is [place] of the [place] and eating differently something may have irritated his stomach. I told them that he could try Imodium OTC or any anti-diarrheal. I told them there are not many anti-nausea medications available OTC, I told them that they could try Dramamine which is used for motion sickness. I told the pt to make sure he is staying hydrated, his wife denies any GI symptoms. Pt has been scheduled for a f/u appt with Dr. Havery Moros on Friday, 06/01/21 at 8:10 am. They know that they can call to cancel appt is patient is feeling better when he returns. Pt and his wife verbalized understanding and had no concerns at the end of the call.

## 2021-05-28 ENCOUNTER — Telehealth: Payer: Self-pay | Admitting: Gastroenterology

## 2021-05-28 DIAGNOSIS — K56609 Unspecified intestinal obstruction, unspecified as to partial versus complete obstruction: Secondary | ICD-10-CM

## 2021-05-28 DIAGNOSIS — R112 Nausea with vomiting, unspecified: Secondary | ICD-10-CM

## 2021-05-28 DIAGNOSIS — R109 Unspecified abdominal pain: Secondary | ICD-10-CM

## 2021-05-28 NOTE — Telephone Encounter (Signed)
I am just seeing this message after hours. Patient has been having vomiting / abdominal pains, and loose stools. States it kind of feels like his prior obstruction. Hasn't been feeling well for the past week, sounds like intermittent symptoms, mostly pain / vomiting. Had pain and vomited recently and felt it decompressed his stomach and felt better. Also took some nexium OTC. States the diarrhea isn't bad and not really bothering him. He has not been eating solids well, has been on liquids all day.   Recommend abdominal xray, ensure no partial obstruction given his history. Jerry Hart can you order an abdominal xray for him to get done tomorrow. He should stay on a liquid diet for now and continue nexium if that has seemed to help. If intolerant of PO, continued vomiting, worsening pain, etc, he needs to go to the ED. He agrees. At this hour he is feeling better than then they called in previously.

## 2021-05-28 NOTE — Telephone Encounter (Signed)
Pt's wife returned call. She reports that patient has still been having diarrhea and self-induced vomiting. She reports that he has about 2-3 large volume episodes of diarrhea daily. She states that he is able to eat and drink but he feels like everything is just sitting in his chest so he makes himself vomit. His wife states that he feels like he is not digesting his food. She reports that he has had weight loss over a period of time, nothing significant within the last week. No fever. He has not tried anti-diarrheal or Imodium that was recommended. Pt has an appt with you on 06/01/21, I offered an appt on Wednesday with an APP but they declined. They wanted to know if you had any other recommendations prior to appt. They are aware that he will need to be evaulated in the ED if he is not able to tolerate anything PO. Please advise, thanks.

## 2021-05-28 NOTE — Telephone Encounter (Signed)
Attempted to reach pt twice, unable to leave a vm at this time. I also tried to reach his wife, her vm is full at this time. No able to leave a message.

## 2021-05-28 NOTE — Telephone Encounter (Signed)
Patient's wife called again and was explained that the nurse had forwarded her message regarding her husband to the doctor and as soon as she heard from the doctor she would return the call.  She stated understanding but also stated she did not want the day to get gone and not having heard anything.

## 2021-05-28 NOTE — Telephone Encounter (Signed)
Patient has an appt with Dr. Havery Moros on Friday, 3/3; however, he is still experience the diarrhea and vomiting and has asked to be seen sooner rather than later.  His wife has also asked for a nurse to call and advise him what to do in the interim.  Please call and advise.

## 2021-05-29 ENCOUNTER — Other Ambulatory Visit: Payer: Self-pay

## 2021-05-29 ENCOUNTER — Ambulatory Visit (INDEPENDENT_AMBULATORY_CARE_PROVIDER_SITE_OTHER)
Admission: RE | Admit: 2021-05-29 | Discharge: 2021-05-29 | Disposition: A | Discharge status: Home / Self Care | Payer: BLUE CROSS/BLUE SHIELD | Source: Ambulatory Visit | Attending: Gastroenterology | Admitting: Gastroenterology

## 2021-05-29 DIAGNOSIS — R109 Unspecified abdominal pain: Secondary | ICD-10-CM | POA: Diagnosis not present

## 2021-05-29 DIAGNOSIS — R112 Nausea with vomiting, unspecified: Secondary | ICD-10-CM | POA: Diagnosis not present

## 2021-05-29 DIAGNOSIS — K56609 Unspecified intestinal obstruction, unspecified as to partial versus complete obstruction: Secondary | ICD-10-CM

## 2021-05-29 NOTE — Telephone Encounter (Signed)
Called and spoke with patient to inform him that the order for the abdominal x-ray has been entered and he can stop by the x-ray department in the basement of our office at his convenience today. Patient verbalized understanding and had no concerns at the end of the call.

## 2021-05-29 NOTE — Addendum Note (Signed)
Addended by: Yevette Edwards on: 05/29/2021 09:58 AM   Modules accepted: Orders

## 2021-05-31 ENCOUNTER — Telehealth: Payer: Self-pay | Admitting: Gastroenterology

## 2021-05-31 NOTE — Telephone Encounter (Signed)
I spoke with Dr. Johney Maine about the patient. I will be seeing him tomorrow in the office to review everything and will relay my impression to Dr. Johney Maine ? ?

## 2021-05-31 NOTE — Telephone Encounter (Signed)
Abigail Butts from [facility] called stated Dr. Johney Maine is wanting a call from Dr. Havery Moros to kind of go over what he is wanting to do for this patient after his visit tomorrow. [phone] Doc Line ?

## 2021-06-01 ENCOUNTER — Encounter: Payer: Self-pay | Admitting: Gastroenterology

## 2021-06-01 ENCOUNTER — Telehealth: Payer: Self-pay

## 2021-06-01 ENCOUNTER — Ambulatory Visit: Payer: BLUE CROSS/BLUE SHIELD | Admitting: Gastroenterology

## 2021-06-01 VITALS — BP 114/72 | HR 71 | Ht 75.0 in | Wt 300.0 lb

## 2021-06-01 DIAGNOSIS — K529 Noninfective gastroenteritis and colitis, unspecified: Secondary | ICD-10-CM | POA: Diagnosis not present

## 2021-06-01 DIAGNOSIS — Z1211 Encounter for screening for malignant neoplasm of colon: Secondary | ICD-10-CM | POA: Diagnosis not present

## 2021-06-01 DIAGNOSIS — Z8719 Personal history of other diseases of the digestive system: Secondary | ICD-10-CM

## 2021-06-01 DIAGNOSIS — Z8679 Personal history of other diseases of the circulatory system: Secondary | ICD-10-CM | POA: Diagnosis not present

## 2021-06-01 DIAGNOSIS — R112 Nausea with vomiting, unspecified: Secondary | ICD-10-CM

## 2021-06-01 NOTE — Progress Notes (Signed)
HPI :  51 year old male with history of diabetes, CHF, sleep apnea, here for a follow up visit.  Recall I last saw him in the office in August 2022.  He was admitted to the hospital in July with acute onset abdominal pain with abdominal distention and vomiting. CT scan done in the ER showing possible partial small bowel obstruction with a transition point in the right lower quadrant.  He was admitted and followed by the surgical service.  They placed an NG tube for decompression.  He improved over the course of 48 hours, x-ray showed contrast entering the colon was discharged home.  Recall this was his first ever bowel obstruction, he denies any history of abdominal surgeries in the past.  We performed a follow-up MR enterography study in August 2022 which did not show any abnormality to cause his obstruction, although there was some diminished detail due to motion artifact.  We decided to monitor moving forward.  He is overdue for colonoscopy and I had spoken with his cardiologist Dr. Doylene Canard about doing an echocardiogram prior to proceeding with colonoscopy.  He agreed to do this, the patient has not followed up to get the echocardiogram done.  Patient states he has been doing well but was on a cruise in [place] last week.  Developed mid to upper abdominal pain and recurrent nausea vomiting, could not eat well.  He and his wife decided to leave the cruise cut short their vacation and came home.  He did not eat solid food for a few days and was mostly on liquids.  We did an abdominal x-ray which suggested a partial obstruction of the small bowel.  He states he was passing some gas and small amount of stool during this episode so was not completely obstructed but he states symptoms felt exactly like they did before when he had an obstruction.  Eventually after few days of liquid diet he was able to tolerate food again for the past few days.  In between these episodes he essentially has not had any  problems with nausea or vomiting.  He does have loose bowels at baseline, roughly 2 loose stools per day.  Ongoing for several months.  No blood in his stools.  More recently in fact states stools have been a bit more solid.  Has been on Ozempic for the past 2 years, held his last dose.  He previously took some Imodium but has not taken in the past 4 months.  Denies any problems with reflux, or nausea vomiting at baseline.  States currently feels back at his baseline.   He and his wife are concerned about recurrent obstruction.  They travel quite a bit and are concerned about traveling in the future given these occurrences, inquiring about next steps.  Of note, he has a history of an abnormal nuclear stress test in 2015 with a small fixed defects in the anterior septal wall with no evidence of reversible ischemia.  He had global hypokinesia and EF of 33% at that time.  He states he is followed with Dr. Doylene Canard of cardiology and had an ultrasound of his heart since then but does not recall when this was.  No record in his chart available for this.  He states his diabetes is better controlled with an A1c of 6.9 recently.   He has never had any prior colon cancer screening.  No prior colonoscopy.  He has no family history of colon cancer.     CT scan  abdomen pelvis October 17, 2020: IMPRESSION: Partial small bowel obstruction, with transition point in right abdomen, suspicious for adhesion. Right nephrolithiasis. No evidence of ureteral calculi or hydronephrosis.   Right upper quadrant ultrasound 10/17/2020: IMPRESSION: 1. Normal gallbladder and normal caliber common bile duct. 2. Fatty infiltration of the liver but no hepatic lesions.  Iron studies NORMAL 11/17/20  MRE 11/25/20: IMPRESSION: 1. Exam detail diminished due to motion artifact. 2. No evidence for bowel wall thickening, inflammation or distension. No findings to explain patient's recent bowel obstruction.  Abdominal xray  05/29/21: IMPRESSION: Small bowel dilatation compatible with recurrent, possibly partial small bowel obstruction.   Past Medical History:  Diagnosis Date   Arthritis    in his hip   Chest pain 08/14/2012   Diabetes mellitus without complication ([place])    TYPE 2   DOE (dyspnea on exertion)    whenever he is working hard   Gout    left fingers   Hypertension    Sleep apnea    Small bowel obstruction ([facility])      Past Surgical History:  Procedure Laterality Date   CARDIAC CATHETERIZATION     2014   HIP PINNING Right 04/02/1983   LEFT HEART CATHETERIZATION WITH CORONARY ANGIOGRAM N/A 08/17/2012   Procedure: LEFT HEART CATHETERIZATION WITH CORONARY ANGIOGRAM;  Surgeon: Birdie Riddle, MD;  Location: [facility];  Service: Cardiovascular;  Laterality: N/A;   TOTAL HIP ARTHROPLASTY Right 03/28/2014   Procedure: RIGHT TOTAL HIP ARTHROPLASTY;  Surgeon: Kerin Salen, MD;  Location: [facility];  Service: Orthopedics;  Laterality: Right;   Family History  Problem Relation Age of Onset   Ovarian cancer Mother    Hypertension Mother    Pancreatic cancer Mother    Diabetes Maternal Grandfather    Stomach cancer Neg Hx    Esophageal cancer Neg Hx    Colon cancer Neg Hx    Social History   Tobacco Use   Smoking status: Never   Smokeless tobacco: Never  Vaping Use   Vaping Use: Never used  Substance Use Topics   Alcohol use: No   Drug use: No   Current Outpatient Medications  Medication Sig Dispense Refill   amLODipine-olmesartan (AZOR) 5-40 MG tablet Take 1 tablet by mouth daily. Reported on 09/01/2015     glimepiride (AMARYL) 4 MG tablet Take 4 mg by mouth daily with breakfast.     Semaglutide,0.25 or 0.5MG /DOS, 2 MG/1.5ML SOPN Inject 0.5 mg into the skin every Tuesday.     SYNJARDY XR 25-1000 MG TB24 Take 1 tablet by mouth daily.     traMADol (ULTRAM) 50 MG tablet Take 50 mg by mouth daily as needed.     No current facility-administered medications for this visit.   No Known  Allergies   Review of Systems: All systems reviewed and negative except where noted in HPI.    DG Abd 2 Views  Result Date: 05/29/2021 CLINICAL DATA:  Nausea, vomiting, and abdominal pain. History of small bowel obstruction. EXAM: ABDOMEN - 2 VIEW COMPARISON:  Abdominal radiographs 10/19/2020 FINDINGS: There are multiple loops of dilated small bowel primarily in the right mid abdomen measuring 4.5-5 cm in diameter. A few small air-fluid levels are noted. A small amount of gas and stool are present in the colon. Right hip arthroplasty is noted. IMPRESSION: Small bowel dilatation compatible with recurrent, possibly partial small bowel obstruction. Electronically Signed   By: Logan Bores M.D.   On: 05/29/2021 15:56    Lab Results  Component Value Date   WBC 4.7 10/18/2020   HGB 16.5 10/18/2020   HCT 54.2 (H) 10/18/2020   MCV 80.2 10/18/2020   PLT 267 10/18/2020    Lab Results  Component Value Date   IRON 94 11/17/2020   TIBC 338.8 11/17/2020   FERRITIN 177.2 11/17/2020    Physical Exam: BP 114/72    Pulse 71    Ht 6\' 3"  (1.905 m)    Wt 300 lb (136.1 kg)    SpO2 99%    BMI 37.50 kg/m  Constitutional: Pleasant,well-developed, male in no acute distress. Abdominal: Soft, nondistended, nontender. There are no masses palpable.  Extremities: no edema Neurological: Alert and oriented to person place and time. Skin: Skin is warm and dry. No rashes noted. Psychiatric: Normal mood and affect. Behavior is normal.   ASSESSMENT AND PLAN: 51 year old male here for reassessment of the following:  History of small bowel obstruction Chronic diarrhea Colon cancer screening History of CHF  As above, admitted to the hospital last year with suspected small bowel obstruction that was treated conservatively.  Unclear etiology of this.  He had a follow-up MR enterography which was normal.  Now presenting with recurrent obstructive symptoms while on a cruise last week.  Managed to tolerate this at  home and went on a liquid diet for a few days with eventual resolution, but symptoms were very consistent with his prior obstructive symptoms and x-ray suggested partial obstruction.  He and his wife travel frequently, concerned about this recurrence and next steps.  I did speak with Dr. Johney Maine yesterday about his case.  Would like repeat abdominal imaging with CT scan to see if we can find any abnormality that could account for this.  We discussed with recurrent obstructions he may warrant an exploratory laparotomy with surgery, but would like repeat imaging to see if there is a focal point we can identify, however now feeling better unclear we will see anything.  Patient strongly wants to proceed with a CT scan and will coordinate that.  If he has any recurrent obstructive symptoms in the interim he needs to call me or go to the emergency room.  We discussed that he does have chronic loose stools that are mild.  Has been ongoing for a bit, but he has been on Ozempic for the past 1 to 2 years, unclear if this potentially could be related to it.  He has held off his last Ozempic dose and his stools appear better this week.  He needs to speak with his prescribing physician Dr. Doylene Canard to see if this can be held for a few weeks and may be the culprit for his symptoms, may want to change his regimen.  He otherwise does warrant a colonoscopy for screening purposes.  His last iron panel is normal despite chronic microcytosis which is good.  We had recommended an echocardiogram prior to scheduling a colonoscopy given his low EF back in 2015 and no follow-up since then. Dr. Doylene Canard try to get a hold of him but hard time doing so, I confirmed phone number with the patient and his wife today.  He needs to get an echocardiogram done before colonoscopy is scheduled, he would also need this before any consideration for elective surgery such as an ex lap.  They agree with the plan, moving forward recommend low residual diet  which we counseled him on.  If he has any recurrence of symptoms he needs to go on a liquid diet and contact me.  Plan: - CT scan abdomen / pelvis with contrast - needs to call Dr. Doylene Canard for echocardiogram and to talk about holding Ozempic - could be causing his loose stools - colonoscopy to be scheduled once he has an echocardiogram - I have discussed his case with Dr. Johney Maine of general surgery. Will await CT findings, may need ex-lap - low residual diet for now, call if recurrent symptoms of obstruction or if severe symptoms will need to go to the ED  I spent 40 minutes of time, including in depth chart review, face-to-face time with the patient, coordinating care, and documenting this encounter.   Jolly Mango, MD [facility]astro[facility]

## 2021-06-01 NOTE — Telephone Encounter (Signed)
Called and spoke to patient. He knows to go the lab for BMET in the next few business days ?

## 2021-06-01 NOTE — Patient Instructions (Addendum)
If you are age 49 or older, your body mass index should be between 23-30. Your Body mass index is 37.5 kg/m?Marland Kitchen If this is out of the aforementioned range listed, please consider follow up with your Primary Care Provider. ? ?If you are age 84 or younger, your body mass index should be between 19-25. Your Body mass index is 37.5 kg/m?Marland Kitchen If this is out of the aformentioned range listed, please consider follow up with your Primary Care Provider.  ? ?________________________________________________________ ? ?The [facility] providers would like to encourage you to use [facility] to communicate with providers for non-urgent requests or questions.  Due to long hold times on the telephone, sending your provider a message by [facility] may be a faster and more efficient way to get a response.  Please allow 48 business hours for a response.  Please remember that this is for non-urgent requests.  ?_______________________________________________________ ? ?You have been scheduled for a CT scan of the abdomen and pelvis at [facility] ([address]---this is in the same building as [facility]).  ? ?You are scheduled on _________________________ at ___________________. You should arrive 15 minutes prior to your appointment time for registration. Please follow the written instructions below on the day of your exam: ? ?WARNING: IF YOU ARE ALLERGIC TO IODINE/X-RAY DYE, PLEASE NOTIFY RADIOLOGY IMMEDIATELY AT [id number]! YOU WILL BE GIVEN A 13 HOUR PREMEDICATION PREP. ? ?1) Do not eat  anything after ______________ (4 hours prior to your test) ?2) You have been given 2 bottles of oral contrast to drink. The solution may taste better if refrigerated, but do NOT add ice or any other liquid to this solution. Shake well before drinking. ?  ? Drink 1 bottle of contrast @ _______________ (2 hours prior to your exam) ? Drink 1 bottle of contrast @ _______________ (1 hour prior to your exam) ? ?You may take  any medications as prescribed with a small amount of water, if necessary. If you take any of the following medications: METFORMIN, GLUCOPHAGE, GLUCOVANCE, AVANDAMET, RIOMET, FORTAMET, ACTOPLUS MET, JANUMET, [place] or METAGLIP, you MAY be asked to HOLD this medication 48 hours AFTER the exam. ? ?The purpose of you drinking the oral contrast is to aid in the visualization of your intestinal tract. The contrast solution may cause some diarrhea. Depending on your individual set of symptoms, you may also receive an intravenous injection of x-ray contrast/dye. Plan on being at [facility] for 30 minutes or longer, depending on the type of exam you are having performed. ? ?This test typically takes 30-45 minutes to complete. ? ?If you have any questions regarding your exam or if you need to reschedule, you may call the CT department at [phone] between the hours of 8:00 am and 5:00 pm, Monday-Friday. ? ?________________________________________________________________________  ? ?Please contact Dr. Merrilee Jansky office to arrange for an ECHO.  Discuss holding your Ozempic as well. ? ?We are giving you a Low Residual Diet handout to review and follow. ? ?Thank you for entrusting me with your care and for choosing [employer], ?Dr. Culver Cellar ? ? ?

## 2021-06-05 ENCOUNTER — Other Ambulatory Visit (INDEPENDENT_AMBULATORY_CARE_PROVIDER_SITE_OTHER): Payer: BLUE CROSS/BLUE SHIELD

## 2021-06-05 DIAGNOSIS — Z8719 Personal history of other diseases of the digestive system: Secondary | ICD-10-CM

## 2021-06-05 DIAGNOSIS — K529 Noninfective gastroenteritis and colitis, unspecified: Secondary | ICD-10-CM

## 2021-06-05 DIAGNOSIS — R112 Nausea with vomiting, unspecified: Secondary | ICD-10-CM | POA: Diagnosis not present

## 2021-06-05 LAB — BASIC METABOLIC PANEL
BUN: 14 mg/dL (ref 6–23)
CO2: 25 mEq/L (ref 19–32)
Calcium: 9.7 mg/dL (ref 8.4–10.5)
Chloride: 100 mEq/L (ref 96–112)
Creatinine, Ser: 0.81 mg/dL (ref 0.40–1.50)
GFR: 103.94 mL/min (ref >60.00)
Glucose, Bld: 121 mg/dL — ABNORMAL HIGH (ref 70–99)
Potassium: 3.7 mEq/L (ref 3.5–5.1)
Sodium: 134 mEq/L — ABNORMAL LOW (ref 135–145)

## 2021-06-07 ENCOUNTER — Other Ambulatory Visit: Payer: Self-pay

## 2021-06-07 ENCOUNTER — Ambulatory Visit (INDEPENDENT_AMBULATORY_CARE_PROVIDER_SITE_OTHER)
Admission: RE | Admit: 2021-06-07 | Discharge: 2021-06-07 | Disposition: A | Discharge status: Home / Self Care | Payer: BLUE CROSS/BLUE SHIELD | Source: Ambulatory Visit | Attending: Gastroenterology | Admitting: Gastroenterology

## 2021-06-07 DIAGNOSIS — Z8679 Personal history of other diseases of the circulatory system: Secondary | ICD-10-CM

## 2021-06-07 DIAGNOSIS — Z1211 Encounter for screening for malignant neoplasm of colon: Secondary | ICD-10-CM

## 2021-06-07 DIAGNOSIS — K529 Noninfective gastroenteritis and colitis, unspecified: Secondary | ICD-10-CM

## 2021-06-07 DIAGNOSIS — Z8719 Personal history of other diseases of the digestive system: Secondary | ICD-10-CM

## 2021-06-07 MED ORDER — IOHEXOL 300 MG/ML  SOLN
100.0000 mL | Freq: Once | INTRAMUSCULAR | Status: AC | PRN
  Administered 2021-06-07: 100 mL via INTRAVENOUS

## 2022-01-14 ENCOUNTER — Emergency Department (HOSPITAL_BASED_OUTPATIENT_CLINIC_OR_DEPARTMENT_OTHER): Payer: BLUE CROSS/BLUE SHIELD | Admitting: Radiology

## 2022-01-14 ENCOUNTER — Telehealth: Payer: Self-pay | Admitting: Gastroenterology

## 2022-01-14 ENCOUNTER — Encounter (HOSPITAL_BASED_OUTPATIENT_CLINIC_OR_DEPARTMENT_OTHER): Payer: Self-pay

## 2022-01-14 ENCOUNTER — Other Ambulatory Visit: Payer: Self-pay

## 2022-01-14 ENCOUNTER — Emergency Department (HOSPITAL_BASED_OUTPATIENT_CLINIC_OR_DEPARTMENT_OTHER): Payer: BLUE CROSS/BLUE SHIELD

## 2022-01-14 ENCOUNTER — Emergency Department (HOSPITAL_BASED_OUTPATIENT_CLINIC_OR_DEPARTMENT_OTHER)
Admission: EM | Admit: 2022-01-14 | Discharge: 2022-01-14 | Disposition: A | Discharge status: Home / Self Care | Payer: BLUE CROSS/BLUE SHIELD | Attending: Emergency Medicine | Admitting: Emergency Medicine

## 2022-01-14 DIAGNOSIS — Z79899 Other long term (current) drug therapy: Secondary | ICD-10-CM | POA: Insufficient documentation

## 2022-01-14 DIAGNOSIS — R1012 Left upper quadrant pain: Secondary | ICD-10-CM | POA: Insufficient documentation

## 2022-01-14 DIAGNOSIS — R319 Hematuria, unspecified: Secondary | ICD-10-CM | POA: Diagnosis not present

## 2022-01-14 DIAGNOSIS — E119 Type 2 diabetes mellitus without complications: Secondary | ICD-10-CM | POA: Insufficient documentation

## 2022-01-14 DIAGNOSIS — R509 Fever, unspecified: Secondary | ICD-10-CM | POA: Insufficient documentation

## 2022-01-14 DIAGNOSIS — I10 Essential (primary) hypertension: Secondary | ICD-10-CM | POA: Insufficient documentation

## 2022-01-14 LAB — CBC WITH DIFFERENTIAL/PLATELET
Abs Immature Granulocytes: 0.04 10*3/uL (ref 0.00–0.07)
Basophils Absolute: 0 10*3/uL (ref 0.0–0.1)
Basophils Relative: 1 %
Eosinophils Absolute: 0.1 10*3/uL (ref 0.0–0.5)
Eosinophils Relative: 1 %
HCT: 49 % (ref 39.0–52.0)
Hemoglobin: 15.6 g/dL (ref 13.0–17.0)
Immature Granulocytes: 1 %
Lymphocytes Relative: 19 %
Lymphs Abs: 1.6 10*3/uL (ref 0.7–4.0)
MCH: 24.5 pg — ABNORMAL LOW (ref 26.0–34.0)
MCHC: 31.8 g/dL (ref 30.0–36.0)
MCV: 77 fL — ABNORMAL LOW (ref 80.0–100.0)
Monocytes Absolute: 1 10*3/uL (ref 0.1–1.0)
Monocytes Relative: 11 %
Neutro Abs: 5.9 10*3/uL (ref 1.7–7.7)
Neutrophils Relative %: 67 %
Platelets: 233 10*3/uL (ref 150–400)
RBC: 6.36 MIL/uL — ABNORMAL HIGH (ref 4.22–5.81)
RDW: 18 % — ABNORMAL HIGH (ref 11.5–15.5)
WBC: 8.7 10*3/uL (ref 4.0–10.5)
nRBC: 0 % (ref 0.0–0.2)

## 2022-01-14 LAB — COMPREHENSIVE METABOLIC PANEL
ALT: 17 U/L (ref 0–44)
AST: 21 U/L (ref 15–41)
Albumin: 4 g/dL (ref 3.5–5.0)
Alkaline Phosphatase: 78 U/L (ref 38–126)
Anion gap: 12 (ref 5–15)
BUN: 15 mg/dL (ref 6–20)
CO2: 22 mmol/L (ref 22–32)
Calcium: 9.4 mg/dL (ref 8.9–10.3)
Chloride: 98 mmol/L (ref 98–111)
Creatinine, Ser: 0.77 mg/dL (ref 0.61–1.24)
GFR, Estimated: >60 mL/min (ref >60)
Glucose, Bld: 145 mg/dL — ABNORMAL HIGH (ref 70–99)
Potassium: 3.8 mmol/L (ref 3.5–5.1)
Sodium: 132 mmol/L — ABNORMAL LOW (ref 135–145)
Total Bilirubin: 0.8 mg/dL (ref 0.3–1.2)
Total Protein: 8.4 g/dL — ABNORMAL HIGH (ref 6.5–8.1)

## 2022-01-14 LAB — URINALYSIS, ROUTINE W REFLEX MICROSCOPIC
Bilirubin Urine: NEGATIVE
Glucose, UA: 500 mg/dL — AB
Ketones, ur: >80 mg/dL — AB
Leukocytes,Ua: NEGATIVE
Nitrite: NEGATIVE
Protein, ur: >300 mg/dL — AB
Specific Gravity, Urine: 1.03 (ref 1.005–1.030)
pH: 6 (ref 5.0–8.0)

## 2022-01-14 LAB — TROPONIN I (HIGH SENSITIVITY): Troponin I (High Sensitivity): 7 ng/L (ref <18)

## 2022-01-14 LAB — LIPASE, BLOOD: Lipase: 39 U/L (ref 11–51)

## 2022-01-14 MED ORDER — HYDROCODONE-ACETAMINOPHEN 5-325 MG PO TABS: 1.0000 | ORAL_TABLET | ORAL | 0 refills | Status: DC | PRN

## 2022-01-14 MED ORDER — KETOROLAC TROMETHAMINE 30 MG/ML IJ SOLN
30.0000 mg | Freq: Once | INTRAMUSCULAR | Status: AC
  Administered 2022-01-14: 30 mg via INTRAVENOUS
  Filled 2022-01-14: qty 1

## 2022-01-14 NOTE — Telephone Encounter (Signed)
Inbound call from patients wife stating that patient is having abd pain and is needing a sooner appointment than 11/1 with Dr. Havery Moros. I advised her that was the soonest we had with both the doctor and the PA. Patients wife stated she is going to take him to the ER but is requesting a call back to discuss symptoms and to see if he can be seen sooner. Please advise.

## 2022-01-14 NOTE — ED Provider Notes (Signed)
Patient's care assumed at 7 PM.  Patient has left-sided flank and back pain.  Patient has had a CT scan of his abdomen which showed no evidence of kidney stone.  Patient has had an EKG and chest x-ray which are nonacute.  Patient is awaiting troponins. Patient reevaluated at 8:20 PM he states he wants to go home patient has had 1 troponin which is negative.  He reports he has been having the pain for the past 2 days.  Patient is nontender on abdominal exam.  Patient does not seem to be having chest pain or neopulmonary sounding pain.  Patient states he sees Dr. Doylene Canard.  I advised him of the calcifications that were noted on his scans and advised him to schedule follow-up with Dr. Federico Flake for complete evaluation.  Patient is given a prescription for hydrocodone for pain.  I suspect pain is more muscular.  He is advised to return to the emergency department if symptoms worsen or change.  Patient chooses to defer second troponin test.   Fransico Meadow, PA-C 09/98/33 [id number]    Lianne Cure, DO 53/97/67 [id number]

## 2022-01-14 NOTE — Telephone Encounter (Signed)
Noted, thanks!

## 2022-01-14 NOTE — Telephone Encounter (Signed)
Patient's wife called back states they will keep 11/1 appointment.

## 2022-01-14 NOTE — Discharge Instructions (Addendum)
Follow up with your Physician for recheck  

## 2022-01-14 NOTE — ED Provider Notes (Signed)
[facility] EMERGENCY DEPT Provider Note   CSN: [id number] Arrival date & time: 01/14/22  1200     History  Chief Complaint  Patient presents with   Abdominal Pain   HPI Jerry Hart is a 51 y.o. male with history of small bowel obstruction, hypertension, diabetes presenting for abdominal pain. Started on Friday.  Located in the left mid abdomen.  Pain is sharp and comes and goes. He is taken ibuprofen to alleviate his symptoms which is helped somewhat.  Pain is nonradiating.  States that he did have a kidney stone 10 years ago.  Also endorses hematuria subjective fever.  Denies painful urination.   Abdominal Pain      Home Medications Prior to Admission medications   Medication Sig Start Date End Date Taking? Authorizing Provider  amLODipine-olmesartan (AZOR) 5-40 MG tablet Take 1 tablet by mouth daily. Reported on 09/01/2015    [provider]  glimepiride (AMARYL) 4 MG tablet Take 4 mg by mouth daily with breakfast.    [provider]  Semaglutide,0.25 or 0.'5MG'$ /DOS, 2 MG/1.5ML SOPN Inject 0.5 mg into the skin every Tuesday.    [provider]  SYNJARDY XR 25-1000 MG TB24 Take 1 tablet by mouth daily. 05/15/21   [provider]  traMADol (ULTRAM) 50 MG tablet Take 50 mg by mouth daily as needed. 06/30/20   [provider]      Allergies    Patient has no known allergies.    Review of Systems   Review of Systems  Gastrointestinal:  Positive for abdominal pain.    Physical Exam Updated Vital Signs BP (!) 151/90   Pulse 83   Temp 99.3 F (37.4 C) (Oral)   Resp (!) 24   Ht '6\' 3"'$  (1.905 m)   Wt (!) 137.4 kg   SpO2 98%   BMI 37.87 kg/m  Physical Exam Vitals and nursing note reviewed.  HENT:     Head: Normocephalic and atraumatic.     Mouth/Throat:     Mouth: Mucous membranes are moist.  Eyes:     General:        Right eye: No discharge.        Left eye: No discharge.     Conjunctiva/sclera:  Conjunctivae normal.  Cardiovascular:     Rate and Rhythm: Normal rate and regular rhythm.     Pulses: Normal pulses.     Heart sounds: Normal heart sounds.  Pulmonary:     Effort: Pulmonary effort is normal.     Breath sounds: Normal breath sounds.  Abdominal:     General: Abdomen is flat.     Palpations: Abdomen is soft.     Tenderness: There is abdominal tenderness in the left upper quadrant.  Skin:    General: Skin is warm and dry.  Neurological:     General: No focal deficit present.  Psychiatric:        Mood and Affect: Mood normal.     ED Results / Procedures / Treatments   Labs (all labs ordered are listed, but only abnormal results are displayed) Labs Reviewed  COMPREHENSIVE METABOLIC PANEL - Abnormal; Notable for the following components:      Result Value   Sodium 132 (*)    Glucose, Bld 145 (*)    Total Protein 8.4 (*)    All other components within normal limits  CBC WITH DIFFERENTIAL/PLATELET - Abnormal; Notable for the following components:   RBC 6.36 (*)    MCV 77.0 (*)  MCH 24.5 (*)    RDW 18.0 (*)    All other components within normal limits  URINALYSIS, ROUTINE W REFLEX MICROSCOPIC - Abnormal; Notable for the following components:   Glucose, UA 500 (*)    Hgb urine dipstick MODERATE (*)    Ketones, ur >80 (*)    Protein, ur >300 (*)    Bacteria, UA RARE (*)    All other components within normal limits  LIPASE, BLOOD  TROPONIN I (HIGH SENSITIVITY)    EKG None  Radiology DG Chest 2 View  Result Date: 01/14/2022 CLINICAL DATA:  Left upper quadrant pain EXAM: CHEST - 2 VIEW COMPARISON:  Chest x-ray 12/06/2016 FINDINGS: The heart size and mediastinal contours are within normal limits. Both lungs are clear. The visualized skeletal structures are unremarkable. IMPRESSION: No active cardiopulmonary disease. Electronically Signed   By: Ronney Asters M.D.   On: 01/14/2022 18:47   CT Renal Stone Study  Result Date: 01/14/2022 CLINICAL DATA:  Left  flank and abdomen pain for the past 2 days. EXAM: CT ABDOMEN AND PELVIS WITHOUT CONTRAST TECHNIQUE: Multidetector CT imaging of the abdomen and pelvis was performed following the standard protocol without IV contrast. RADIATION DOSE REDUCTION: This exam was performed according to the departmental dose-optimization program which includes automated exposure control, adjustment of the mA and/or kV according to patient size and/or use of iterative reconstruction technique. COMPARISON:  06/07/2021. FINDINGS: Lower chest: Borderline enlarged heart. Dense coronary artery calcifications. Minimal pericardial effusion with a maximum thickness of 6 mm. Interval small bilateral pleural effusions and mild bibasilar atelectasis. The previously demonstrated 5 mm ground-glass nodule in the left lower lobe is not currently visualized. There are small calcified granulomata at both lung bases. Hepatobiliary: No focal liver abnormality is seen. No gallstones, gallbladder wall thickening, or biliary dilatation. Pancreas: Interval mild peripancreatic soft tissue stranding, most pronounced involving the distal body and tail, with an interval increase in size of the distal body and tail. Spleen: Normal in size without focal abnormality. Adrenals/Urinary Tract: Normal appearing adrenal glands. 7 mm upper pole right renal calculus or 2 adjacent smaller calculi. Tiny calculus in the lower pole of each kidney. No bladder or ureteral calculi and no hydronephrosis. Stomach/Bowel: Stomach is within normal limits. Appendix appears normal. No evidence of bowel wall thickening, distention, or inflammatory changes. Vascular/Lymphatic: No significant vascular findings are present. No enlarged abdominal or pelvic lymph nodes. Reproductive: Moderately enlarged prostate gland. Other: No abdominal wall hernia or abnormality. No abdominopelvic ascites. Musculoskeletal: Right hip prosthesis. Lumbar and lower thoracic spine degenerative changes, most  pronounced at the L3-4, L4-5 and L5-S1 levels. IMPRESSION: 1. Interval changes of acute, uncomplicated pancreatitis. 2. Interval small bilateral pleural effusions and mild bibasilar atelectasis. 3. Dense atheromatous coronary artery calcifications. 4. Minimal pericardial effusion. 5. Small, nonobstructing bilateral renal calculi. 6. Moderately enlarged prostate gland. 7. The previously demonstrated 5 mm ground-glass nodule in the left lower lobe is not currently visualized. Electronically Signed   By: Claudie Revering M.D.   On: 01/14/2022 17:57    Procedures Procedures    Medications Ordered in ED Medications  ketorolac (TORADOL) 30 MG/ML injection 30 mg (30 mg Intravenous Given 01/14/22 1816)    ED Course/ Medical Decision Making/ A&P                           Medical Decision Making Amount and/or Complexity of Data Reviewed Labs: ordered. Radiology: ordered.   This patient presents to the  ED for concern of abdominal pain, this involves a number of treatment options, and is a complaint that carries with it a high risk of complications and morbidity.  The differential diagnosis includes nephrolithiasis, ACS, pancreatitis, pneumothorax, and PUD.   Co morbidities: Discussed in HPI   EMR reviewed including pt PMHx, past surgical history and past visits to ER.   See HPI for more details   Lab Tests:   I ordered and independently interpreted labs. Labs notable for hematuria, bacteriuria   Imaging Studies:  Abnormal findings. I personally reviewed all imaging studies. Imaging notable for - Findings consistent with acute uncomplicated pancreatitis - Bilateral nonobstructive renal calculi - Bilateral small pleural effusions, small groundglass nodule in left lower lobe of lung - Moderately enlarged prostate - Dense atheromatous coronary artery calcifications    Cardiac Monitoring:  The patient was maintained on a cardiac monitor.  I personally viewed and interpreted the cardiac  monitored which showed an underlying rhythm of:NSR  EKG pending.   Medicines ordered:  I ordered medication including Toradol for pain.  Reevaluation of the patient after these medicines showed that the patient stayed the same I have reviewed the patients home medicines and have made adjustments as needed   Critical Interventions:  Signed out patient to oncoming APP. No Critical interventions required at this time.   Consults/Attending Physician    Signed out patient oncoming APP   Reevaluation:  After the interventions noted above I re-evaluated patient and found that they have :stayed the same    Problem List / ED Course: Patient presented for left upper quadrant pain.  Also endorsed hematuria.  Initially concern for kidney stone.  CT scan did reveal bilateral renal calculi nonobstructed.  Incidental finding did include calcification of his coronary arteries.  Given that his pain was left upper quadrant in setting of hypertension, diabetes, hyper cholesterolemia further evaluation for ACS was warranted.  Ordered EKG and troponins and also chest x-ray given reported small pleural effusions per CT scan.  Signed out patient to oncoming APP.  Plan at this time is to evaluate for ACS as possible etiology for patient's left upper quadrant pain.  Dispostion:  No disposition at this time.  Signed out patient to oncoming APP.        Final Clinical Impression(s) / ED Diagnoses Final diagnoses:  Left upper quadrant pain    Rx / DC Orders ED Discharge Orders     None         Harriet Pho, PA-C 01/14/22 1851    Tegeler, Gwenyth Allegra, MD 01/15/22 [phone]

## 2022-01-14 NOTE — Telephone Encounter (Signed)
Returned call to patient's wife. She states that they are currently at the ED, she states that pt is experiencing symptoms of obstruction again. I told her that patient should remain in the ED for evaluation as they may order imaging that can be done quicker while he is there vs [place] ordering as outpatient. The soonest patient can be scheduled is for 01/24/22 at 1:30 pm with Ellouise Newer, PA-C. Pt's wife would like to give me a call back once she checks with patient regarding the appt. She has been advised that we can't hold both appts so she will need to call me back ASAP. Pt's wife verbalized understanding of information and had no concerns at the end of the call.

## 2022-01-14 NOTE — ED Triage Notes (Signed)
Pt c/o left sided abdominal pain Denies N/V Started 2 days ago

## 2022-01-24 ENCOUNTER — Ambulatory Visit: Payer: BLUE CROSS/BLUE SHIELD | Admitting: Physician Assistant

## 2022-01-30 ENCOUNTER — Encounter: Payer: Self-pay | Admitting: Gastroenterology

## 2022-01-30 ENCOUNTER — Telehealth: Payer: Self-pay

## 2022-01-30 ENCOUNTER — Ambulatory Visit: Payer: BLUE CROSS/BLUE SHIELD | Admitting: Gastroenterology

## 2022-01-30 VITALS — BP 130/72 | HR 72 | Ht 75.0 in | Wt 292.0 lb

## 2022-01-30 DIAGNOSIS — Z8679 Personal history of other diseases of the circulatory system: Secondary | ICD-10-CM

## 2022-01-30 DIAGNOSIS — Z1211 Encounter for screening for malignant neoplasm of colon: Secondary | ICD-10-CM

## 2022-01-30 DIAGNOSIS — R1032 Left lower quadrant pain: Secondary | ICD-10-CM | POA: Diagnosis not present

## 2022-01-30 DIAGNOSIS — R9389 Abnormal findings on diagnostic imaging of other specified body structures: Secondary | ICD-10-CM

## 2022-01-30 NOTE — Telephone Encounter (Signed)
error 

## 2022-01-30 NOTE — Progress Notes (Signed)
HPI :  51 y/o male here for a follow up visit after recent ED visit for abdominal pain. Recall he has a history of DM, CHF, OSA, small bowel obstruction.   Recall in 2022 he was admitted for an SBO. Managed conservatively. Follow up MRE Aug 2022 did not show any abnormality to cause his obstruction, although there was some diminished detail due to motion artifact.  He had some recurrence of symptoms earlier in the year concerning for possible obstruction when he was vacationing in [place]. We did an abdominal x-ray which suggested a partial obstruction of the small bowel.  A follow-up CT scan in last March showed no active obstruction.  He was seen by surgery and they decided to hold off on any operative intervention at that time.  He is accompanied by his wife today.  He has been feeling okay at baseline but then presented to the ER on October 16 with some left lower quadrant pain.  He states it was constant there, associated with poor appetite.  He did not have any vomiting.  Denied any epigastric pain.  Did not radiate to his back etc.  He was concerned about possible kidney stone.  He underwent noncontrast CT scan which showed changes concerning for acute pancreatitis.  He had some nonobstructive renal stones.  He also had pleural effusions and some dense coronary calcifications.  He was discharged home from the emergency room, was not admitted.  He states symptoms lasted a few days and then resolved.  He had no pain since then.  He is unaware of the CT findings, was never told he had pancreatitis.  States has never had a problem with his pancreas in the past.  Denies any use of alcohol.  Denies any gallbladder problems in the past.  His liver enzymes were normal as were his lipase levels.  He had a ultrasound of his gallbladder last year which showed no gallstones.  He denies any postprandial pain at baseline.  Again has never had any epigastric pain or vomiting with this.  Denies any new  medications.  Has been off Ozempic.  Denies any blood in his stools.  Denies any problems with his bowels.  Recall that he is never had a colonoscopy and we had hoped to do a colonoscopy at some point for him.  He has no family history of colon cancer.  He has a history of CHF in the past, follows with Dr. Carlisle Beers of cardiology.  He actually recently saw him last week.  The patient is unaware if he has had a follow-up echocardiogram in recent years in relation to his last cardiac imaging on file that we can see from 2015 showing impaired EF of 33%.  He denies any cardiopulmonary symptoms, no dyspnea.  He thinks he has had lipid panel checked in the past year but not sure, nothing on file in epic.  Abnormal nuclear stress test in 2015 with a small fixed defects in the anterior septal wall with no evidence of reversible ischemia.  He had global hypokinesia and EF of 33% at that time.      Prior evaluation: CT scan abdomen pelvis October 17, 2020: IMPRESSION: Partial small bowel obstruction, with transition point in right abdomen, suspicious for adhesion. Right nephrolithiasis. No evidence of ureteral calculi or hydronephrosis.   Right upper quadrant ultrasound 10/17/2020: IMPRESSION: 1. Normal gallbladder and normal caliber common bile duct. 2. Fatty infiltration of the liver but no hepatic lesions.   Iron studies  NORMAL 11/17/20   MRE 11/25/20: IMPRESSION: 1. Exam detail diminished due to motion artifact. 2. No evidence for bowel wall thickening, inflammation or distension. No findings to explain patient's recent bowel obstruction.   Abdominal xray 05/29/21: IMPRESSION: Small bowel dilatation compatible with recurrent, possibly partial small bowel obstruction.     CT abdomen / pelvis with contrast 06/07/21: IMPRESSION: 1. No acute abnormality identified in the abdomen or pelvis. 2. Right renal calculus. 3. Suggestion of hepatic steatosis.    CT renal stones 01/14/22: IMPRESSION: 1.  Interval changes of acute, uncomplicated pancreatitis. 2. Interval small bilateral pleural effusions and mild bibasilar atelectasis. 3. Dense atheromatous coronary artery calcifications. 4. Minimal pericardial effusion. 5. Small, nonobstructing bilateral renal calculi. 6. Moderately enlarged prostate gland. 7. The previously demonstrated 5 mm ground-glass nodule in the left lower lobe is not currently visualized.   Past Medical History:  Diagnosis Date   Arthritis    in his hip   Chest pain 08/14/2012   Diabetes mellitus without complication ([place])    TYPE 2   DOE (dyspnea on exertion)    whenever he is working hard   Gout    left fingers   Hypertension    Sleep apnea    Small bowel obstruction ([place])      Past Surgical History:  Procedure Laterality Date   CARDIAC CATHETERIZATION     2014   HIP PINNING Right 04/02/1983   LEFT HEART CATHETERIZATION WITH CORONARY ANGIOGRAM N/A 08/17/2012   Procedure: LEFT HEART CATHETERIZATION WITH CORONARY ANGIOGRAM;  Surgeon: Birdie Riddle, MD;  Location: [facility] CATH LAB;  Service: Cardiovascular;  Laterality: N/A;   TOTAL HIP ARTHROPLASTY Right 03/28/2014   Procedure: RIGHT TOTAL HIP ARTHROPLASTY;  Surgeon: Kerin Salen, MD;  Location: [facility];  Service: Orthopedics;  Laterality: Right;   Family History  Problem Relation Age of Onset   Ovarian cancer Mother    Hypertension Mother    Pancreatic cancer Mother    Diabetes Maternal Grandfather    Stomach cancer Neg Hx    Esophageal cancer Neg Hx    Colon cancer Neg Hx    Social History   Tobacco Use   Smoking status: Never   Smokeless tobacco: Never  Vaping Use   Vaping Use: Never used  Substance Use Topics   Alcohol use: No   Drug use: No   Current Outpatient Medications  Medication Sig Dispense Refill   amLODipine-olmesartan (AZOR) 5-40 MG tablet Take 1 tablet by mouth daily. Reported on 09/01/2015     glimepiride (AMARYL) 4 MG tablet Take 4 mg by mouth daily with breakfast.      Semaglutide,0.25 or 0.'5MG'$ /DOS, 2 MG/1.5ML SOPN Inject 0.5 mg into the skin every Tuesday.     SYNJARDY XR 25-1000 MG TB24 Take 1 tablet by mouth daily.     No current facility-administered medications for this visit.   No Known Allergies   Review of Systems: All systems reviewed and negative except where noted in HPI.    DG Chest 2 View  Result Date: 01/14/2022 CLINICAL DATA:  Left upper quadrant pain EXAM: CHEST - 2 VIEW COMPARISON:  Chest x-ray 12/06/2016 FINDINGS: The heart size and mediastinal contours are within normal limits. Both lungs are clear. The visualized skeletal structures are unremarkable. IMPRESSION: No active cardiopulmonary disease. Electronically Signed   By: Ronney Asters M.D.   On: 01/14/2022 18:47   CT Renal Stone Study  Result Date: 01/14/2022 CLINICAL DATA:  Left flank and abdomen pain for the  past 2 days. EXAM: CT ABDOMEN AND PELVIS WITHOUT CONTRAST TECHNIQUE: Multidetector CT imaging of the abdomen and pelvis was performed following the standard protocol without IV contrast. RADIATION DOSE REDUCTION: This exam was performed according to the departmental dose-optimization program which includes automated exposure control, adjustment of the mA and/or kV according to patient size and/or use of iterative reconstruction technique. COMPARISON:  06/07/2021. FINDINGS: Lower chest: Borderline enlarged heart. Dense coronary artery calcifications. Minimal pericardial effusion with a maximum thickness of 6 mm. Interval small bilateral pleural effusions and mild bibasilar atelectasis. The previously demonstrated 5 mm ground-glass nodule in the left lower lobe is not currently visualized. There are small calcified granulomata at both lung bases. Hepatobiliary: No focal liver abnormality is seen. No gallstones, gallbladder wall thickening, or biliary dilatation. Pancreas: Interval mild peripancreatic soft tissue stranding, most pronounced involving the distal body and tail, with an  interval increase in size of the distal body and tail. Spleen: Normal in size without focal abnormality. Adrenals/Urinary Tract: Normal appearing adrenal glands. 7 mm upper pole right renal calculus or 2 adjacent smaller calculi. Tiny calculus in the lower pole of each kidney. No bladder or ureteral calculi and no hydronephrosis. Stomach/Bowel: Stomach is within normal limits. Appendix appears normal. No evidence of bowel wall thickening, distention, or inflammatory changes. Vascular/Lymphatic: No significant vascular findings are present. No enlarged abdominal or pelvic lymph nodes. Reproductive: Moderately enlarged prostate gland. Other: No abdominal wall hernia or abnormality. No abdominopelvic ascites. Musculoskeletal: Right hip prosthesis. Lumbar and lower thoracic spine degenerative changes, most pronounced at the L3-4, L4-5 and L5-S1 levels. IMPRESSION: 1. Interval changes of acute, uncomplicated pancreatitis. 2. Interval small bilateral pleural effusions and mild bibasilar atelectasis. 3. Dense atheromatous coronary artery calcifications. 4. Minimal pericardial effusion. 5. Small, nonobstructing bilateral renal calculi. 6. Moderately enlarged prostate gland. 7. The previously demonstrated 5 mm ground-glass nodule in the left lower lobe is not currently visualized. Electronically Signed   By: Claudie Revering M.D.   On: 01/14/2022 17:57    Lab Results  Component Value Date   WBC 8.7 01/14/2022   HGB 15.6 01/14/2022   HCT 49.0 01/14/2022   MCV 77.0 (L) 01/14/2022   PLT 233 01/14/2022    Lab Results  Component Value Date   CREATININE 0.77 01/14/2022   BUN 15 01/14/2022   NA 132 (L) 01/14/2022   K 3.8 01/14/2022   CL 98 01/14/2022   CO2 22 01/14/2022    Lab Results  Component Value Date   ALT 17 01/14/2022   AST 21 01/14/2022   ALKPHOS 78 01/14/2022   BILITOT 0.8 01/14/2022    Lab Results  Component Value Date   LIPASE 39 01/14/2022    Physical Exam: BP 130/72   Pulse 72   Ht 6'  3" (1.905 m)   Wt 292 lb (132.5 kg)   SpO2 98%   BMI 36.50 kg/m  Constitutional: Pleasant,well-developed, male in no acute distress. Abdominal: Soft, nondistended, nontender. There are no masses palpable.  Extremities: no edema Neurological: Alert and oriented to person place and time. Skin: Skin is warm and dry. No rashes noted. Psychiatric: Normal mood and affect. Behavior is normal.   ASSESSMENT: 51 y.o. male here for assessment of the following  1. LLQ pain   2. Abnormal CT scan   3. History of CHF (congestive heart failure)   4. Colon cancer screening    As above, history of small bowel obstruction in the past, thought to have recurrent symptoms earlier this year  with an x-ray showing a partial obstruction and follow-up CT scan being normal.  Surgery recommended holding off on any surgery at the time.  He has not had recurrence of his typical obstructive symptoms but was in the ED with left lower quadrant pain recently.  CT appears to show some acute pancreatitis although his lipase was normal, LFTs normal, he had symptoms of left lower quadrant pain that seem atypical for acute pancreatitis.  Further, he does not drink any alcohol, his LFTs are normal, no gallstones on ultrasound imaging last year, no new meds to be causing this.  If he had pancreatitis unclear what would have caused that.  We may consider some follow-up imaging in upcoming months to reassess his pancreas, and monitor for recurrence of symptoms in the interim.  He is overdue for colonoscopy and warrants this.  We had discussed risk benefits of colonoscopy and anesthesia any wants to proceed.  However, he does have a significant cardiac history and need to clarify with his cardiologist, Dr. Doylene Canard, if he has had any recent work-up of his heart in comparison to his 2015 study, as his prior depressed EF would prohibit any procedures done as an outpatient.  He states he had a cardiology visit last week, we will reach out to  their office to get records, and I will try to discuss with Dr. Doylene Canard if he can have an updated echocardiogram if he has none on file recently.  We will also check his lipid panel to make sure normal in light of question of pancreatitis.   PLAN: - imaging reviewed. CT shows pancreatitis but clinically very atypical for this. No risk factors, LFTs normal. RUQ [place] negative last year, no meds to cause this. May consider follow up pancreatic imaging - follow up 3-4 months. Contact me if recurrent symptoms - needs a screening colonoscopy but need more information from Dr. Doylene Canard / cardiology prior to proceeding. Will reach out to their office for last echocardiogram and lab results (lipid panel). If he has hasn't an echo since 2015 he needs one for anesthesia clearance - our staff will circle back with their office if we have not heard anything in one week  I spent 35 minutes of time, including in depth chart review,  face-to-face time with the patient, coordinating care, and documentation.   Jolly Mango, MD [facility]  CC: Dixie Dials, MD

## 2022-01-30 NOTE — Patient Instructions (Addendum)
If you are age 49 or older, your body mass index should be between 23-30. Your Body mass index is 36.5 kg/m. If this is out of the aforementioned range listed, please consider follow up with your Primary Care Provider.  If you are age 2 or younger, your body mass index should be between 19-25. Your Body mass index is 36.5 kg/m. If this is out of the aformentioned range listed, please consider follow up with your Primary Care Provider.   ________________________________________________________   We will request your lab and ECHO records from Dr. Doylene Canard before scheduling you for a colonoscopy.  Please follow up in 4 months.  Thank you for entrusting me with your care and for choosing [facility], Dr. Bressler Cellar

## 2022-02-14 ENCOUNTER — Telehealth: Payer: Self-pay | Admitting: Gastroenterology

## 2022-02-14 NOTE — Telephone Encounter (Signed)
Records arrived from Dr. Merrilee Jansky office.  Echocardiogram 08/31/19: EF 30-35%, moderate diastolic dysfunction   Jerry Hart can you take a look at this patient's chart. His last echo is 2021, he denied any cardiopulmonary symptoms at his last office visit with me on 11/1. He needs a colonoscopy. Are you okay with proceeding at the [facility] or would you prefer cardiac clearance / more recent echocardiogram. Thanks

## 2022-02-15 NOTE — Telephone Encounter (Signed)
Dr. Havery Moros,  His last documented cardiac EF disqualifies him for care at [facility].  Thanks,  Osvaldo Angst

## 2022-02-18 NOTE — Telephone Encounter (Signed)
Called and left detailed message for patient. Asked him to call back to discuss.

## 2022-02-18 NOTE — Telephone Encounter (Signed)
Jan can you help contact this patient and relay the following:  We got his last echo from Dr. Doylene Canard - done in June 2021, EF 30-35%. As it stands now, based on that EF, we can't do his case in the office, would need to be > 35%.  Since it has been 2.5 years since his last echo may be reasonable for him to repeat that at Dr. Merrilee Jansky office if it can be done in a reasonable time frame. If not, then his case would need to be done at the hospital with anesthesia support.  Can you let him know my recommendations? If he does not want to get the repeat echo let me know and we will have to add him to the wait list for hospital cases. thanks

## 2022-02-27 NOTE — Telephone Encounter (Signed)
Called and left another detailed message for patient and mentioned the importance of colon caner screening. Asked him to call back and let us know how he would like to proceed.

## 2022-03-01 NOTE — Telephone Encounter (Signed)
Correction, Monday, Jan 15th at [facility]

## 2022-03-01 NOTE — Telephone Encounter (Signed)
Dr. Havery Moros indicated he could do his procedure on Monday, Jan 11th at [facility] or Thursday, Feb 8th at [facility].  LM for patient to call back to see which date he would rather do.

## 2022-03-01 NOTE — Telephone Encounter (Signed)
Patient called back. He would like to be scheduled at the hospital and IF he has an ECHO that yields a high enough EF, he will call back to cancel hospital procedure and reschedule in the [facility].

## 2022-03-04 ENCOUNTER — Other Ambulatory Visit: Payer: Self-pay

## 2022-03-04 DIAGNOSIS — Z8679 Personal history of other diseases of the circulatory system: Secondary | ICD-10-CM

## 2022-03-04 DIAGNOSIS — R9389 Abnormal findings on diagnostic imaging of other specified body structures: Secondary | ICD-10-CM

## 2022-03-04 DIAGNOSIS — Z1211 Encounter for screening for malignant neoplasm of colon: Secondary | ICD-10-CM

## 2022-03-04 DIAGNOSIS — R1032 Left lower quadrant pain: Secondary | ICD-10-CM

## 2022-03-04 NOTE — Telephone Encounter (Signed)
Patient called and is available to have his procedure on Monday 04-15-22. Patient has been Scheduled at [facility] at 7:30am (case [id number]). Patient knows he will need to arrive at 6:00am. Patient has been scheduled for a previsit on Wed, 1-3 at 1:30pm. Letter mailed to patient with information.

## 2022-03-13 NOTE — Telephone Encounter (Signed)
Incoming call from patient states he is returning your call. I informed him of his appointment dates says that's all he needed to know.

## 2022-04-03 ENCOUNTER — Ambulatory Visit (AMBULATORY_SURGERY_CENTER): Payer: BLUE CROSS/BLUE SHIELD | Admitting: *Deleted

## 2022-04-03 VITALS — Ht 75.0 in | Wt 296.0 lb

## 2022-04-03 DIAGNOSIS — Z1211 Encounter for screening for malignant neoplasm of colon: Secondary | ICD-10-CM

## 2022-04-03 MED ORDER — NA SULFATE-K SULFATE-MG SULF 17.5-3.13-1.6 GM/177ML PO SOLN: 1.0000 | Freq: Once | ORAL | 0 refills | Status: AC

## 2022-04-03 NOTE — Progress Notes (Signed)
No egg or soy allergy known to patient  No issues known to pt with past sedation with any surgeries or procedures Patient denies ever being told they had issues or difficulty with intubation  No FH of Malignant Hyperthermia Pt is not on diet pills Pt is not on  home 02  Pt is not on blood thinners  Pt denies issues with constipation  Pt is not on dialysis Pt denies any upcoming cardiac testing Pt encouraged to use to use Singlecare or Goodrx to reduce cost  Patient's chart reviewed by Osvaldo Angst CNRA prior to previsit and patient appropriate for the [place].  Previsit completed and red dot placed by patient's name on their procedure day (on provider's schedule).  . Pre-visit done by phone Information sent by mail and my chart

## 2022-04-14 NOTE — Anesthesia Preprocedure Evaluation (Signed)
Anesthesia Evaluation  Patient identified by MRN, date of birth, ID band Patient awake    Reviewed: Allergy & Precautions, H&P , NPO status , Patient's Chart, lab work & pertinent test results  Airway Mallampati: III  TM Distance: >3 FB Neck ROM: Full    Dental no notable dental hx. (+) Poor Dentition, Dental Advisory Given   Pulmonary sleep apnea    Pulmonary exam normal breath sounds clear to auscultation       Cardiovascular Exercise Tolerance: Good hypertension, Pt. on medications +CHF and + DOE   Rhythm:Regular Rate:Normal     Neuro/Psych negative neurological ROS  negative psych ROS   GI/Hepatic negative GI ROS, Neg liver ROS,,,  Endo/Other  diabetes, Type 2, Oral Hypoglycemic Agents  Morbid obesity  Renal/GU negative Renal ROS  negative genitourinary   Musculoskeletal  (+) Arthritis ,    Abdominal   Peds  Hematology negative hematology ROS (+)   Anesthesia Other Findings   Reproductive/Obstetrics negative OB ROS                             Anesthesia Physical Anesthesia Plan  ASA: 3  Anesthesia Plan: MAC   Post-op Pain Management: Minimal or no pain anticipated   Induction: Intravenous  PONV Risk Score and Plan: 1 and Propofol infusion  Airway Management Planned: Simple Face Mask  Additional Equipment:   Intra-op Plan:   Post-operative Plan:   Informed Consent: I have reviewed the patients History and Physical, chart, labs and discussed the procedure including the risks, benefits and alternatives for the proposed anesthesia with the patient or authorized representative who has indicated his/her understanding and acceptance.     Dental advisory given  Plan Discussed with: CRNA  Anesthesia Plan Comments:        Anesthesia Quick Evaluation

## 2022-04-15 ENCOUNTER — Encounter (HOSPITAL_COMMUNITY)
Admission: RE | Disposition: A | Discharge status: Home / Self Care | Payer: Self-pay | Source: Ambulatory Visit | Attending: Gastroenterology

## 2022-04-15 ENCOUNTER — Ambulatory Visit (HOSPITAL_COMMUNITY): Payer: 59 | Admitting: Certified Registered"

## 2022-04-15 ENCOUNTER — Other Ambulatory Visit: Payer: Self-pay

## 2022-04-15 ENCOUNTER — Ambulatory Visit (HOSPITAL_BASED_OUTPATIENT_CLINIC_OR_DEPARTMENT_OTHER): Payer: 59 | Admitting: Certified Registered"

## 2022-04-15 ENCOUNTER — Ambulatory Visit (HOSPITAL_COMMUNITY)
Admission: RE | Admit: 2022-04-15 | Discharge: 2022-04-15 | Disposition: A | Discharge status: Home / Self Care | Payer: 59 | Source: Ambulatory Visit | Attending: Gastroenterology | Admitting: Gastroenterology

## 2022-04-15 ENCOUNTER — Encounter (HOSPITAL_COMMUNITY): Payer: Self-pay | Admitting: Gastroenterology

## 2022-04-15 DIAGNOSIS — Z79899 Other long term (current) drug therapy: Secondary | ICD-10-CM | POA: Insufficient documentation

## 2022-04-15 DIAGNOSIS — Q438 Other specified congenital malformations of intestine: Secondary | ICD-10-CM | POA: Diagnosis not present

## 2022-04-15 DIAGNOSIS — D126 Benign neoplasm of colon, unspecified: Secondary | ICD-10-CM

## 2022-04-15 DIAGNOSIS — R9389 Abnormal findings on diagnostic imaging of other specified body structures: Secondary | ICD-10-CM

## 2022-04-15 DIAGNOSIS — Z7984 Long term (current) use of oral hypoglycemic drugs: Secondary | ICD-10-CM | POA: Insufficient documentation

## 2022-04-15 DIAGNOSIS — Z6841 Body Mass Index (BMI) 40.0 and over, adult: Secondary | ICD-10-CM | POA: Insufficient documentation

## 2022-04-15 DIAGNOSIS — E119 Type 2 diabetes mellitus without complications: Secondary | ICD-10-CM | POA: Diagnosis not present

## 2022-04-15 DIAGNOSIS — D120 Benign neoplasm of cecum: Secondary | ICD-10-CM

## 2022-04-15 DIAGNOSIS — G4730 Sleep apnea, unspecified: Secondary | ICD-10-CM

## 2022-04-15 DIAGNOSIS — Z1211 Encounter for screening for malignant neoplasm of colon: Secondary | ICD-10-CM

## 2022-04-15 DIAGNOSIS — R1032 Left lower quadrant pain: Secondary | ICD-10-CM

## 2022-04-15 DIAGNOSIS — I509 Heart failure, unspecified: Secondary | ICD-10-CM | POA: Insufficient documentation

## 2022-04-15 DIAGNOSIS — I110 Hypertensive heart disease with heart failure: Secondary | ICD-10-CM | POA: Insufficient documentation

## 2022-04-15 DIAGNOSIS — Z8679 Personal history of other diseases of the circulatory system: Secondary | ICD-10-CM

## 2022-04-15 DIAGNOSIS — K648 Other hemorrhoids: Secondary | ICD-10-CM

## 2022-04-15 DIAGNOSIS — D125 Benign neoplasm of sigmoid colon: Secondary | ICD-10-CM | POA: Insufficient documentation

## 2022-04-15 DIAGNOSIS — K5730 Diverticulosis of large intestine without perforation or abscess without bleeding: Secondary | ICD-10-CM

## 2022-04-15 HISTORY — PX: COLONOSCOPY WITH PROPOFOL: SHX5780

## 2022-04-15 HISTORY — PX: POLYPECTOMY: SHX5525

## 2022-04-15 LAB — GLUCOSE, CAPILLARY: Glucose-Capillary: 162 mg/dL — ABNORMAL HIGH (ref 70–99)

## 2022-04-15 SURGERY — COLONOSCOPY WITH PROPOFOL
Anesthesia: Monitor Anesthesia Care

## 2022-04-15 MED ORDER — LACTATED RINGERS IV SOLN: INTRAVENOUS | Status: DC

## 2022-04-15 MED ORDER — PROPOFOL 500 MG/50ML IV EMUL
INTRAVENOUS | Status: DC | PRN
  Administered 2022-04-15: 100 ug/kg/min via INTRAVENOUS

## 2022-04-15 MED ORDER — SODIUM CHLORIDE 0.9 % IV SOLN: INTRAVENOUS | Status: DC

## 2022-04-15 MED ORDER — PROPOFOL 10 MG/ML IV BOLUS
INTRAVENOUS | Status: DC | PRN
  Administered 2022-04-15 (×2): 30 mg via INTRAVENOUS
  Administered 2022-04-15 (×2): 10 mg via INTRAVENOUS

## 2022-04-15 SURGICAL SUPPLY — 22 items

## 2022-04-15 NOTE — Anesthesia Procedure Notes (Signed)
Procedure Name: MAC Date/Time: 04/15/2022 7:32 AM  Performed by: Moshe Salisbury, CRNAPatient Re-evaluated:Patient Re-evaluated prior to induction Oxygen Delivery Method: Nasal cannula Placement Confirmation: positive ETCO2 (High flow Nasal Cannula) Dental Injury: Teeth and Oropharynx as per pre-operative assessment

## 2022-04-15 NOTE — Transfer of Care (Signed)
Immediate Anesthesia Transfer of Care Note  Patient: Jerry Hart  Procedure(s) Performed: COLONOSCOPY WITH PROPOFOL POLYPECTOMY  Patient Location: Endoscopy Unit  Anesthesia Type:MAC  Level of Consciousness: drowsy  Airway & Oxygen Therapy: Patient Spontanous Breathing and Patient connected to nasal cannula oxygen  Post-op Assessment: Report given to RN, Post -op Vital signs reviewed and stable, and Patient moving all extremities  Post vital signs: Reviewed and stable  Last Vitals:  Vitals Value Taken Time  BP 116/70 04/15/22 0812  Temp    Pulse 76 04/15/22 0813  Resp 20 04/15/22 0813  SpO2 95 % 04/15/22 0813  Vitals shown include unvalidated device data.  Last Pain:  Vitals:   04/15/22 0655  TempSrc: Temporal         Complications: No notable events documented.

## 2022-04-15 NOTE — Discharge Instructions (Signed)
YOU HAD AN ENDOSCOPIC PROCEDURE TODAY: Refer to the procedure report and other information in the discharge instructions given to you for any specific questions about what was found during the examination. If this information does not answer your questions, please call [place] office at [phone] to clarify.   YOU SHOULD EXPECT: Some feelings of bloating in the abdomen. Passage of more gas than usual. Walking can help get rid of the air that was put into your GI tract during the procedure and reduce the bloating. If you had a lower endoscopy (such as a colonoscopy or flexible sigmoidoscopy) you may notice spotting of blood in your stool or on the toilet paper. Some abdominal soreness may be present for a day or two, also.  DIET: Your first meal following the procedure should be a light meal and then it is ok to progress to your normal diet. A half-sandwich or bowl of soup is an example of a good first meal. Heavy or fried foods are harder to digest and may make you feel nauseous or bloated. Drink plenty of fluids but you should avoid alcoholic beverages for 24 hours. If you had a esophageal dilation, please see attached instructions for diet.    ACTIVITY: Your care partner should take you home directly after the procedure. You should plan to take it easy, moving slowly for the rest of the day. You can resume normal activity the day after the procedure however YOU SHOULD NOT DRIVE, use power tools, machinery or perform tasks that involve climbing or major physical exertion for 24 hours (because of the sedation medicines used during the test).   SYMPTOMS TO REPORT IMMEDIATELY: A gastroenterologist can be reached at any hour. Please call [phone]  for any of the following symptoms:  Following lower endoscopy (colonoscopy, flexible sigmoidoscopy) Excessive amounts of blood in the stool  Significant tenderness, worsening of abdominal pains  Swelling of the abdomen that is new, acute  Fever of 100 or  higher  Following upper endoscopy (EGD, EUS, ERCP, esophageal dilation) Vomiting of blood or coffee ground material  New, significant abdominal pain  New, significant chest pain or pain under the shoulder blades  Painful or persistently difficult swallowing  New shortness of breath  Black, tarry-looking or red, bloody stools  FOLLOW UP:  If any biopsies were taken you will be contacted by phone or by letter within the next 1-3 weeks. Call [phone]  if you have not heard about the biopsies in 3 weeks.  Please also call with any specific questions about appointments or follow up tests.YOU HAD AN ENDOSCOPIC PROCEDURE TODAY: Refer to the procedure report and other information in the discharge instructions given to you for any specific questions about what was found during the examination. If this information does not answer your questions, please call [place] office at [phone] to clarify.   YOU SHOULD EXPECT: Some feelings of bloating in the abdomen. Passage of more gas than usual. Walking can help get rid of the air that was put into your GI tract during the procedure and reduce the bloating. If you had a lower endoscopy (such as a colonoscopy or flexible sigmoidoscopy) you may notice spotting of blood in your stool or on the toilet paper. Some abdominal soreness may be present for a day or two, also.  DIET: Your first meal following the procedure should be a light meal and then it is ok to progress to your normal diet. A half-sandwich or bowl of soup is an example of a   good first meal. Heavy or fried foods are harder to digest and may make you feel nauseous or bloated. Drink plenty of fluids but you should avoid alcoholic beverages for 24 hours. If you had a esophageal dilation, please see attached instructions for diet.    ACTIVITY: Your care partner should take you home directly after the procedure. You should plan to take it easy, moving slowly for the rest of the day. You can resume  normal activity the day after the procedure however YOU SHOULD NOT DRIVE, use power tools, machinery or perform tasks that involve climbing or major physical exertion for 24 hours (because of the sedation medicines used during the test).   SYMPTOMS TO REPORT IMMEDIATELY: A gastroenterologist can be reached at any hour. Please call [phone]  for any of the following symptoms:  Following lower endoscopy (colonoscopy, flexible sigmoidoscopy) Excessive amounts of blood in the stool  Significant tenderness, worsening of abdominal pains  Swelling of the abdomen that is new, acute  Fever of 100 or higher  Following upper endoscopy (EGD, EUS, ERCP, esophageal dilation) Vomiting of blood or coffee ground material  New, significant abdominal pain  New, significant chest pain or pain under the shoulder blades  Painful or persistently difficult swallowing  New shortness of breath  Black, tarry-looking or red, bloody stools  FOLLOW UP:  If any biopsies were taken you will be contacted by phone or by letter within the next 1-3 weeks. Call [phone]  if you have not heard about the biopsies in 3 weeks.  Please also call with any specific questions about appointments or follow up tests. 

## 2022-04-15 NOTE — H&P (Signed)
[facility] History and Physical   Primary Care Physician:  Dixie Dials, MD   Reason for Procedure:   Colon cancer screening  Plan:    colonoscopy     HPI: Jerry Hart is a 52 y.o. male  here for colonoscopy screening - first time exam. Patient denies any bowel symptoms at this time. No family history of colon cancer known. Otherwise feels well without any cardiopulmonary symptoms today. Last echo June 2021, EF 30-35%, followed by cardiology. Case done at the hospital for anesthesia support given his cardiac history as outlined below.   I have discussed risks / benefits of anesthesia and endoscopic procedure with [name] and they wish to proceed with the exams as outlined today. All questions answered.    Past Medical History:  Diagnosis Date   Arthritis    in his hip   Chest pain 08/14/2012   CHF (congestive heart failure) ([place])    Diabetes mellitus without complication ([place])    TYPE 2   DOE (dyspnea on exertion)    whenever he is working hard   Gout    left fingers   Hypertension    Sleep apnea    Small bowel obstruction ([place])     Past Surgical History:  Procedure Laterality Date   CARDIAC CATHETERIZATION     2014   HIP PINNING Right 04/02/1983   LEFT HEART CATHETERIZATION WITH CORONARY ANGIOGRAM N/A 08/17/2012   Procedure: LEFT HEART CATHETERIZATION WITH CORONARY ANGIOGRAM;  Surgeon: Birdie Riddle, MD;  Location: [place] CATH LAB;  Service: Cardiovascular;  Laterality: N/A;   TOTAL HIP ARTHROPLASTY Right 03/28/2014   Procedure: RIGHT TOTAL HIP ARTHROPLASTY;  Surgeon: Kerin Salen, MD;  Location: [place];  Service: Orthopedics;  Laterality: Right;    Prior to Admission medications   Medication Sig Start Date End Date Taking? Authorizing Provider  amLODipine (NORVASC) 5 MG tablet Take 5 mg by mouth in the morning.   Yes [provider]  glimepiride (AMARYL) 4 MG tablet Take 4 mg by mouth daily with breakfast.   Yes [provider]  rosuvastatin (CRESTOR) 20 MG tablet Take 20 mg by mouth at bedtime.   Yes [provider]  Semaglutide,0.25 or 0.'5MG'$ /DOS, 2 MG/1.5ML SOPN Inject 1 mg into the skin once a week. Friday/Saturday   Yes [provider]  SYNJARDY XR 25-1000 MG TB24 Take 1 tablet by mouth daily. 05/15/21  Yes [provider]  tadalafil (CIALIS) 5 MG tablet Take 5 mg by mouth daily.   Yes [provider]    Current Facility-Administered Medications  Medication Dose Route Frequency Provider Last Rate Last Admin   0.9 %  sodium chloride infusion   Intravenous Continuous Evalin Shawhan, Carlota Raspberry, MD       lactated ringers infusion   Intravenous Continuous Adonys Wildes, Carlota Raspberry, MD        Allergies as of 03/04/2022   (No Known Allergies)    Family History  Problem Relation Age of Onset   Ovarian cancer Mother    Hypertension Mother    Pancreatic cancer Mother    Diabetes Maternal Grandfather    Stomach cancer Neg Hx    Esophageal cancer Neg Hx    Colon cancer Neg Hx    Colon polyps Neg Hx    Rectal cancer Neg Hx     Social History   Socioeconomic History   Marital status: Married    Spouse name: Not on file   Number of children:  3   Years of education: Not on file   Highest education level: Not on file  Occupational History   Occupation: Truck Geophysicist/field seismologist  Tobacco Use   Smoking status: Never   Smokeless tobacco: Never  Vaping Use   Vaping Use: Never used  Substance and Sexual Activity   Alcohol use: No   Drug use: No   Sexual activity: Yes  Other Topics Concern   Not on file  Social History Narrative   ** Merged History Encounter **       Social Determinants of Health   Financial Resource Strain: Not on file  Food Insecurity: Not on file  Transportation Needs: Not on file  Physical Activity: Not on file  Stress: Not on file  Social Connections: Not on file  Intimate Partner Violence: Not on file    Review of Systems: All other review of systems  negative except as mentioned in the HPI.  Physical Exam: Vital signs BP 137/78   Temp (!) 96.5 F (35.8 C) (Temporal)   Resp 18   Ht 6' (1.829 m)   Wt 134.3 kg   SpO2 98%   BMI 40.16 kg/m   General:   Alert,  Well-developed, pleasant and cooperative in NAD Lungs:  Clear throughout to auscultation.   Heart:  Regular rate and rhythm Abdomen:  Soft, nontender and nondistended.   Neuro/Psych:  Alert and cooperative. Normal mood and affect. A and O x 3  Jolly Mango, MD [facility]

## 2022-04-15 NOTE — Anesthesia Postprocedure Evaluation (Signed)
Anesthesia Post Note  Patient: Jerry Hart  Procedure(s) Performed: COLONOSCOPY WITH PROPOFOL POLYPECTOMY     Patient location during evaluation: Endoscopy Anesthesia Type: MAC Level of consciousness: awake and alert Pain management: pain level controlled Vital Signs Assessment: post-procedure vital signs reviewed and stable Respiratory status: spontaneous breathing, nonlabored ventilation and respiratory function stable Cardiovascular status: stable and blood pressure returned to baseline Postop Assessment: no apparent nausea or vomiting Anesthetic complications: no  No notable events documented.  Last Vitals:  Vitals:   04/15/22 0821 04/15/22 0830  BP: 113/74 128/79  Pulse: 69 66  Resp: 16 14  Temp:    SpO2: 95% 98%    Last Pain:  Vitals:   04/15/22 0830  TempSrc:   PainSc: 0-No pain                 Radie Berges,W. EDMOND

## 2022-04-15 NOTE — Op Note (Signed)
[facility] Patient Name: Jerry Hart Procedure Date : 04/15/2022 MRN: [id number] Attending MD: Carlota Raspberry. Havery Moros , MD, [id number] Date of Birth: 1970-08-12 CSN: [id number] Age: 52 Admit Type: Outpatient Procedure:                Colonoscopy Indications:              Screening for colorectal malignant neoplasm, This                            is the patient's first colonoscopy Providers:                Carlota Raspberry. Havery Moros, MD, Helane Rima, RN,                            William Dalton, Technician Referring MD:              Medicines:                Monitored Anesthesia Care Complications:            No immediate complications. Estimated blood loss:                            Minimal. Estimated Blood Loss:     Estimated blood loss was minimal. Procedure:                Pre-Anesthesia Assessment:                           - Prior to the procedure, a History and Physical                            was performed, and patient medications and                            allergies were reviewed. The patient's tolerance of                            previous anesthesia was also reviewed. The risks                            and benefits of the procedure and the sedation                            options and risks were discussed with the patient.                            All questions were answered, and informed consent                            was obtained. Prior Anticoagulants: The patient has                            taken no anticoagulant or antiplatelet agents. ASA  Grade Assessment: IV - A patient with severe                            systemic disease that is a constant threat to life.                            After reviewing the risks and benefits, the patient                            was deemed in satisfactory condition to undergo the                            procedure.                           After obtaining informed  consent, the colonoscope                            was passed under direct vision. Throughout the                            procedure, the patient's blood pressure, pulse, and                            oxygen saturations were monitored continuously. The                            CF-HQ190L ([id number]) Olympus colonoscope was                            introduced through the anus and advanced to the the                            cecum, identified by appendiceal orifice and                            ileocecal valve. The colonoscopy was performed                            without difficulty. The patient tolerated the                            procedure well. The quality of the bowel                            preparation was adequate. The ileocecal valve,                            appendiceal orifice, and rectum were photographed. Scope In: 7:38:45 AM Scope Out: 8:01:04 AM Scope Withdrawal Time: 0 hours 14 minutes 32 seconds  Total Procedure Duration: 0 hours 22 minutes 19 seconds  Findings:      The perianal and digital rectal examinations were normal.      A 3 mm polyp was found in the cecum. The  polyp was sessile. The polyp       was removed with a cold snare. Resection and retrieval were complete.      A 3 mm polyp was found in the sigmoid colon. The polyp was sessile. The       polyp was removed with a cold snare. Resection and retrieval were       complete. (of note - photo was taken but not stored in [employer])      A few small-mouthed diverticula were found in the transverse colon.      The colon was redundant with looping in the right colon, abdominal       pressure utilized to achieve cecal intubation.      Internal hemorrhoids were found. The hemorrhoids were small.      The exam was otherwise without abnormality. Impression:               - One 3 mm polyp in the cecum, removed with a cold                            snare. Resected and retrieved.                            - One 3 mm polyp in the sigmoid colon, removed with                            a cold snare. Resected and retrieved.                           - Diverticulosis in the transverse colon.                           - Redundant colon with looping in the right colon.                           - Internal hemorrhoids.                           - The examination was otherwise normal. Recommendation:           - Patient has a contact number available for                            emergencies. The signs and symptoms of potential                            delayed complications were discussed with the                            patient. Return to normal activities tomorrow.                            Written discharge instructions were provided to the                            patient.                           -  Resume previous diet.                           - Continue present medications.                           - Await pathology results. Procedure Code(s):        --- Professional ---                           [phone], Colonoscopy, flexible; with removal of                            tumor(s), polyp(s), or other lesion(s) by snare                            technique Diagnosis Code(s):        --- Professional ---                           Z12.11, Encounter for screening for malignant                            neoplasm of colon                           D12.0, Benign neoplasm of cecum                           D12.5, Benign neoplasm of sigmoid colon                           K64.8, Other hemorrhoids                           K57.30, Diverticulosis of large intestine without                            perforation or abscess without bleeding                           Q43.8, Other specified congenital malformations of                            intestine CPT copyright 2022 American Medical Association. All rights reserved. The codes documented in this report are preliminary and upon coder review  may  be revised to meet current compliance requirements. Remo Lipps P. Cozetta Seif, MD 04/15/2022 8:07:01 AM This report has been signed electronically. Number of Addenda: 0

## 2022-04-16 ENCOUNTER — Encounter: Payer: Self-pay | Admitting: Gastroenterology

## 2022-04-16 LAB — SURGICAL PATHOLOGY

## 2022-04-17 ENCOUNTER — Encounter (HOSPITAL_COMMUNITY): Payer: Self-pay | Admitting: Gastroenterology

## 2022-05-09 ENCOUNTER — Encounter: Payer: Self-pay | Admitting: Gastroenterology

## 2022-05-09 ENCOUNTER — Ambulatory Visit (INDEPENDENT_AMBULATORY_CARE_PROVIDER_SITE_OTHER): Payer: 59 | Admitting: Gastroenterology

## 2022-05-09 VITALS — BP 122/70 | HR 81 | Ht 72.0 in | Wt 295.4 lb

## 2022-05-09 DIAGNOSIS — Z800 Family history of malignant neoplasm of digestive organs: Secondary | ICD-10-CM

## 2022-05-09 DIAGNOSIS — Z8719 Personal history of other diseases of the digestive system: Secondary | ICD-10-CM | POA: Diagnosis not present

## 2022-05-09 DIAGNOSIS — Z86010 Personal history of colonic polyps: Secondary | ICD-10-CM

## 2022-05-09 DIAGNOSIS — R933 Abnormal findings on diagnostic imaging of other parts of digestive tract: Secondary | ICD-10-CM

## 2022-05-09 NOTE — Patient Instructions (Addendum)
If you are age 50 or older, your body mass index should be between 23-30. Your Body mass index is 40.06 kg/m. If this is out of the aforementioned range listed, please consider follow up with your Primary Care Provider.  If you are age 1 or younger, your body mass index should be between 19-25. Your Body mass index is 40.06 kg/m. If this is out of the aformentioned range listed, please consider follow up with your Primary Care Provider.   ________________________________________________________  Jerry Hart will be contacted by [facility] in the next 2 days to arrange an MRCP.  The number on your caller ID will be [phone], please answer when they call.  If you have not heard from them in 2 days please call [phone] to schedule.     Thank you for entrusting me with your care and for choosing [facility], Dr. [place] Maria Cellar

## 2022-05-09 NOTE — Progress Notes (Signed)
HPI :  52 y/o male here for a follow up visit for abnormal pancreas imaging, history of SBO, history of colon polyps.  Recall in 2022 he was admitted for an SBO. Managed conservatively. Follow up MRE Aug 2022 did not show any abnormality to cause his obstruction, although there was some diminished detail due to motion artifact.  He had some recurrence of symptoms earlier in the year concerning for possible obstruction when he was vacationing in [place]. Abdominal x-ray which suggested a partial obstruction of the small bowel.  A follow-up CT scan in last March 2023 showed no active obstruction. He was seen by surgery and they decided to hold off on any operative intervention at that time.   Recall he had some recurrent lower abdominal pain back in October 2023.  Went to the ED. He underwent noncontrast CT scan which showed changes concerning for acute pancreatitis.  He had some nonobstructive renal stones.  He also had pleural effusions and some dense coronary calcifications.  States has never had a problem with his pancreas in the past.  Denies any use of alcohol.  Denies any gallbladder problems in the past.  His liver enzymes were normal as were his lipase levels.  He had a ultrasound of his gallbladder last year which showed no gallstones.   Since health last seen him he had a colonoscopy with me last month at the hospital due to his low EF and he had 2 small polyps removed but nothing concerning.  He has not had any problems with his stomach since of last seen him.  He has not had any recurrent obstructive symptoms.  No epigastric pain.  No vomiting.  He is eating well, weight stable.  He does relate to me today that his mother had pancreatic cancer he thinks diagnosed in her 53s.  He denies any other family history of pancreatic cancer.      Prior evaluation: CT scan abdomen pelvis October 17, 2020: IMPRESSION: Partial small bowel obstruction, with transition point in right abdomen,  suspicious for adhesion. Right nephrolithiasis. No evidence of ureteral calculi or hydronephrosis.   Right upper quadrant ultrasound 10/17/2020: IMPRESSION: 1. Normal gallbladder and normal caliber common bile duct. 2. Fatty infiltration of the liver but no hepatic lesions.   Iron studies NORMAL 11/17/20   MRE 11/25/20: IMPRESSION: 1. Exam detail diminished due to motion artifact. 2. No evidence for bowel wall thickening, inflammation or distension. No findings to explain patient's recent bowel obstruction.   Abdominal xray 05/29/21: IMPRESSION: Small bowel dilatation compatible with recurrent, possibly partial small bowel obstruction.      CT abdomen / pelvis with contrast 06/07/21: IMPRESSION: 1. No acute abnormality identified in the abdomen or pelvis. 2. Right renal calculus. 3. Suggestion of hepatic steatosis.     CT renal stones 01/14/22: IMPRESSION: 1. Interval changes of acute, uncomplicated pancreatitis. 2. Interval small bilateral pleural effusions and mild bibasilar atelectasis. 3. Dense atheromatous coronary artery calcifications. 4. Minimal pericardial effusion. 5. Small, nonobstructing bilateral renal calculi. 6. Moderately enlarged prostate gland. 7. The previously demonstrated 5 mm ground-glass nodule in the left lower lobe is not currently visualized.   Last echo from Dr. Doylene Canard - done in June 2021, EF 30-35%.    Colonoscopy 04/15/22: 2 small polyps, diverticulosis, significant looping, hemorrhoids  FINAL MICROSCOPIC DIAGNOSIS:   A.   COLON, CECUM, POLYPECTOMY:  - Sessile serrated polyp.  - No dysplasia or malignancy.   B.   COLON, SIGMOID, POLYPECTOMY:  - Tubular  adenoma.  - No high grade dysplasia or malignancy.   Repeat in 7 years   Past Medical History:  Diagnosis Date   Arthritis    in his hip   Chest pain 08/14/2012   CHF (congestive heart failure) (HCC)    Diabetes mellitus without complication ([place])    TYPE 2   DOE (dyspnea on  exertion)    whenever he is working hard   Gout    left fingers   Hypertension    Sleep apnea    Small bowel obstruction ([place])      Past Surgical History:  Procedure Laterality Date   CARDIAC CATHETERIZATION     2014   COLONOSCOPY WITH PROPOFOL N/A 04/15/2022   Procedure: COLONOSCOPY WITH PROPOFOL;  Surgeon: Yetta Flock, MD;  Location[facility] ENDOSCOPY;  Service: Gastroenterology;  Laterality: N/A;   HIP PINNING Right 04/02/1983   LEFT HEART CATHETERIZATION WITH CORONARY ANGIOGRAM N/A 08/17/2012   Procedure: LEFT HEART CATHETERIZATION WITH CORONARY ANGIOGRAM;  Surgeon: Birdie Riddle, MD;  Location: [facility] CATH LAB;  Service: Cardiovascular;  Laterality: N/A;   POLYPECTOMY  04/15/2022   Procedure: POLYPECTOMY;  Surgeon: Yetta Flock, MD;  Location: [facility] ENDOSCOPY;  Service: Gastroenterology;;   TOTAL HIP ARTHROPLASTY Right 03/28/2014   Procedure: RIGHT TOTAL HIP ARTHROPLASTY;  Surgeon: Kerin Salen, MD;  Location: [place];  Service: Orthopedics;  Laterality: Right;   Family History  Problem Relation Age of Onset   Ovarian cancer Mother    Hypertension Mother    Pancreatic cancer Mother    Diabetes Maternal Grandfather    Stomach cancer Neg Hx    Esophageal cancer Neg Hx    Colon cancer Neg Hx    Colon polyps Neg Hx    Rectal cancer Neg Hx    Social History   Tobacco Use   Smoking status: Never   Smokeless tobacco: Never  Vaping Use   Vaping Use: Never used  Substance Use Topics   Alcohol use: No   Drug use: No   Current Outpatient Medications  Medication Sig Dispense Refill   amLODipine (NORVASC) 5 MG tablet Take 5 mg by mouth in the morning.     glimepiride (AMARYL) 4 MG tablet Take 4 mg by mouth daily with breakfast.     rosuvastatin (CRESTOR) 20 MG tablet Take 20 mg by mouth at bedtime.     Semaglutide,0.25 or 0.'5MG'$ /DOS, 2 MG/1.5ML SOPN Inject 1 mg into the skin once a week. Friday/Saturday     SYNJARDY XR 25-1000 MG TB24 Take 1 tablet by mouth daily.      tadalafil (CIALIS) 5 MG tablet Take 5 mg by mouth daily.     No current facility-administered medications for this visit.   No Known Allergies   Review of Systems: All systems reviewed and negative except where noted in HPI.    Lab Results  Component Value Date   WBC 8.7 01/14/2022   HGB 15.6 01/14/2022   HCT 49.0 01/14/2022   MCV 77.0 (L) 01/14/2022   PLT 233 01/14/2022    Lab Results  Component Value Date   CREATININE 0.77 01/14/2022   BUN 15 01/14/2022   NA 132 (L) 01/14/2022   K 3.8 01/14/2022   CL 98 01/14/2022   CO2 22 01/14/2022    Lab Results  Component Value Date   ALT 17 01/14/2022   AST 21 01/14/2022   ALKPHOS 78 01/14/2022   BILITOT 0.8 01/14/2022    Lab Results  Component Value  Date   IRON 94 11/17/2020   TIBC 338.8 11/17/2020   FERRITIN 177.2 11/17/2020     Physical Exam: BP 122/70   Pulse 81   Ht 6' (1.829 m)   Wt 295 lb 6 oz (134 kg)   SpO2 97%   BMI 40.06 kg/m  Constitutional: Pleasant,well-developed, male in no acute distress. Neurological: Alert and oriented to person place and time. Skin: Skin is warm and dry. No rashes noted. Psychiatric: Normal mood and affect. Behavior is normal.   ASSESSMENT: 52 y.o. male here for assessment of the following  1. Abnormal finding on GI tract imaging   2. Family history of pancreatic cancer   3. History of small bowel obstruction   4. History of colon polyps    Patient incidentally with some inflammatory changes and swelling of his pancreas concerning for acute pancreatitis in October although symptoms were not consistent with that and his lipase was normal.  We had discussed potentially doing some follow-up imaging of his pancreas.  He tells me his mother was diagnosed with pancreatic cancer in her 9s.  Given his prior abnormal imaging, his strong family history of pancreatic cancer, recommend a follow-up MRCP while he is feeling well to ensure no small lesion in his pancreas which could have  caused prior presentation.  He agrees with this and wishes to schedule.  Fortunately he has not had any recurrent obstructive symptoms.  Recall follow-up MR E showed no small bowel lesions and last CT scan in the setting of pain did not show any bowel lesions or evidence of obstruction.  He has seen surgery and they have not recommended operative evaluation.  He will call me if any recurrent obstructive symptoms, otherwise await MRCP.  Repeat colonoscopy in 7 years for surveillance of small polyps.  PLAN: - MRCP to reassess pancreas and ensure no small lesion there - if recurrent obstructive symptoms in the interim contact us - colonoscopy in 7 years  Jolly Mango, MD [facility]astroenter[facility]

## 2022-05-16 ENCOUNTER — Telehealth: Payer: Self-pay | Admitting: Gastroenterology

## 2022-05-16 NOTE — Telephone Encounter (Signed)
Patient is calling states he is returning [place] phone call, has questions regarding his imaging on Saturday. Please advise

## 2022-05-17 NOTE — Telephone Encounter (Signed)
I spoke with Jerry Hart and he wants to do his MRI abdomen MRCP w/wo contrast at [facility]. Phone # [phone], fax # [phone]. I called them and they said just fax the order and they will contact him to set it up. I have contacted our prior authorization group to see if they need to do something before his appointment can be set up.

## 2022-05-17 NOTE — Telephone Encounter (Signed)
The order has been faxed to [facility]. I did not hear back from our prior authorization team before we closed today.

## 2022-05-18 ENCOUNTER — Ambulatory Visit (HOSPITAL_COMMUNITY): Payer: 59

## 2022-05-20 NOTE — Telephone Encounter (Signed)
[place] at [phone]. They have received the order but have not scheduled him yet. They will call him today and will let us know when he is scheduled.

## 2022-06-13 ENCOUNTER — Telehealth: Payer: Self-pay | Admitting: Gastroenterology

## 2022-06-13 NOTE — Telephone Encounter (Signed)
Patient informed of MRCP results.

## 2022-06-13 NOTE — Telephone Encounter (Signed)
Results from MRCP arrived - done in [facility] 06/12/22:  Normal pancreas - prior inflammatory changes have resolved. Normal pancreatic duct. NO mass lesions or concerning findings of the pancreas. Normal CBD. No gallstones. Fatty liver noted.   Jerry Hart can you let him know the pancreas looks good and is reassuring.  I will have it scanned into Epic. Thanks

## 2022-06-14 ENCOUNTER — Ambulatory Visit: Payer: 59 | Admitting: Gastroenterology

## 2022-09-13 ENCOUNTER — Ambulatory Visit: Payer: BLUE CROSS/BLUE SHIELD | Admitting: Family Medicine

## 2022-09-13 ENCOUNTER — Encounter: Payer: Self-pay | Admitting: Family Medicine

## 2022-09-13 ENCOUNTER — Ambulatory Visit (INDEPENDENT_AMBULATORY_CARE_PROVIDER_SITE_OTHER): Payer: Managed Care, Other (non HMO) | Admitting: Family Medicine

## 2022-09-13 ENCOUNTER — Other Ambulatory Visit: Payer: Self-pay | Admitting: Family Medicine

## 2022-09-13 VITALS — BP 102/62 | HR 64 | Temp 97.4°F | Ht 73.5 in | Wt 295.2 lb

## 2022-09-13 DIAGNOSIS — E7800 Pure hypercholesterolemia, unspecified: Secondary | ICD-10-CM

## 2022-09-13 DIAGNOSIS — Z800 Family history of malignant neoplasm of digestive organs: Secondary | ICD-10-CM

## 2022-09-13 DIAGNOSIS — E1159 Type 2 diabetes mellitus with other circulatory complications: Secondary | ICD-10-CM

## 2022-09-13 DIAGNOSIS — Z1159 Encounter for screening for other viral diseases: Secondary | ICD-10-CM

## 2022-09-13 DIAGNOSIS — N529 Male erectile dysfunction, unspecified: Secondary | ICD-10-CM | POA: Insufficient documentation

## 2022-09-13 DIAGNOSIS — Z7985 Long-term (current) use of injectable non-insulin antidiabetic drugs: Secondary | ICD-10-CM | POA: Diagnosis not present

## 2022-09-13 DIAGNOSIS — Z86010 Personal history of colonic polyps: Secondary | ICD-10-CM

## 2022-09-13 DIAGNOSIS — R933 Abnormal findings on diagnostic imaging of other parts of digestive tract: Secondary | ICD-10-CM

## 2022-09-13 DIAGNOSIS — Z8719 Personal history of other diseases of the digestive system: Secondary | ICD-10-CM

## 2022-09-13 DIAGNOSIS — N522 Drug-induced erectile dysfunction: Secondary | ICD-10-CM

## 2022-09-13 DIAGNOSIS — I152 Hypertension secondary to endocrine disorders: Secondary | ICD-10-CM

## 2022-09-13 DIAGNOSIS — M549 Dorsalgia, unspecified: Secondary | ICD-10-CM

## 2022-09-13 DIAGNOSIS — K56609 Unspecified intestinal obstruction, unspecified as to partial versus complete obstruction: Secondary | ICD-10-CM

## 2022-09-13 LAB — COMPREHENSIVE METABOLIC PANEL
ALT: 23 U/L (ref 0–53)
AST: 22 U/L (ref 0–37)
Albumin: 4.4 g/dL (ref 3.5–5.2)
Alkaline Phosphatase: 85 U/L (ref 39–117)
BUN: 11 mg/dL (ref 6–23)
CO2: 26 mEq/L (ref 19–32)
Calcium: 9.1 mg/dL (ref 8.4–10.5)
Chloride: 101 mEq/L (ref 96–112)
Creatinine, Ser: 0.76 mg/dL (ref 0.40–1.50)
GFR: 105.02 mL/min (ref >60.00)
Glucose, Bld: 88 mg/dL (ref 70–99)
Potassium: 4.6 mEq/L (ref 3.5–5.1)
Sodium: 136 mEq/L (ref 135–145)
Total Bilirubin: 0.5 mg/dL (ref 0.2–1.2)
Total Protein: 7.7 g/dL (ref 6.0–8.3)

## 2022-09-13 LAB — LIPID PANEL
Cholesterol: 81 mg/dL (ref 0–200)
HDL: 28.3 mg/dL — ABNORMAL LOW (ref >39.00)
LDL Cholesterol: 44 mg/dL (ref 0–99)
NonHDL: 52.44
Total CHOL/HDL Ratio: 3
Triglycerides: 41 mg/dL (ref 0.0–149.0)
VLDL: 8.2 mg/dL (ref 0.0–40.0)

## 2022-09-13 LAB — HM DIABETES FOOT EXAM

## 2022-09-13 LAB — MICROALBUMIN / CREATININE URINE RATIO
Creatinine,U: 82.3 mg/dL
Microalb Creat Ratio: 15 mg/g (ref 0.0–30.0)
Microalb, Ur: 12.3 mg/dL — ABNORMAL HIGH (ref 0.0–1.9)

## 2022-09-13 LAB — HEMOGLOBIN A1C: Hgb A1c MFr Bld: 8.2 % — ABNORMAL HIGH (ref 4.6–6.5)

## 2022-09-13 NOTE — Assessment & Plan Note (Signed)
Chronic.Marland Kitchen well controlled with cialis  5 mg daily.

## 2022-09-13 NOTE — Assessment & Plan Note (Signed)
Stable, chronic.  Continue current medication.   Well-controlled on amlodipine 5 mg p.o. daily, benazepril 40 mg daily

## 2022-09-13 NOTE — Assessment & Plan Note (Signed)
CAD with hypertension, diabetes and high cholesterol. Encouraged exercise, weight loss, healthy eating habits.

## 2022-09-13 NOTE — Patient Instructions (Addendum)
Set up yearly eye exam for diabetes and have the opthalmologist send [place] a copy of the evaluation for the chart.  Please stop at the lab to have labs drawn. Working on regular exercise 3 to 5 days a week for 20 to 30 minutes at a time to get heart rate up. Work on low carbohydrate diet, decrease bread, pasta, potatoes, sweets, sweet drinks.

## 2022-09-13 NOTE — Assessment & Plan Note (Signed)
Due for re-eval. On crestor 20 mg daily.

## 2022-09-13 NOTE — Assessment & Plan Note (Signed)
2022, unclear cause. February follow-up Dr. Adela Lank recommended repeat imaging of pancreas given mother with history of pancreatic cancer and prior abnormal imaging/SBO Recommended MRCP while he is feeling well to ensure no small lesion in his pancreas that could have caused his prior presentation... per pt repeat MRCP was normal.

## 2022-09-13 NOTE — Assessment & Plan Note (Signed)
Using tizanadine as need for pain .Marland Kitchen Helps some.

## 2022-09-13 NOTE — Assessment & Plan Note (Signed)
Due for re-eval   Ozempic 1 mg every 14 days ( not on weekly given ? SBO issues), on Synjardy XR 25/2000 mg daily, glimperide 4 mg daily

## 2022-09-13 NOTE — Progress Notes (Signed)
Patient ID: Jerry Hart, male    DOB: 08-Oct-1970, 52 y.o.   MRN: [id number]  This visit was conducted in person.  BP 102/62 (BP Location: Left Arm, Patient Position: Sitting, Cuff Size: Large)   Pulse 64   Temp (!) 97.4 F (36.3 C) (Temporal)   Ht 6' 1.5" (1.867 m)   Wt 295 lb 4 oz (133.9 kg)   SpO2 98%   BMI 38.43 kg/m    CC:  Chief Complaint  Patient presents with   Establish Care    Subjective:   HPI: Jerry Hart is a 52 y.o. male presenting on 09/13/2022 for Establish Care  Previous PCP: Dr. Algie Hart... cardiology Last physical: ?  Possibly lab done last 8-12/2022  Reviewed most recent office visit note from Dr. Adela Hart May 09, 2022  Noted in 2022 with small bowel obstruction, managed conservatively,  Follow-up CT scanning in March 2023 showed no active obstruction..  Still with surgical follow-up they held off on any operative intervention at that time.  In October 2023 when he had recurrent lower abdominal pain and back pain CT showed concerns for acute pancreatitis, nonobstructive renal stones and pleural effusions. Had normal liver and lipase levels.  Ultrasound showed no gallstones Colonoscopy January 24 small polyps, recommended repeat in 7 years  February follow-up Dr. Adela Hart recommended repeat imaging of pancreas given mother with history of pancreatic cancer and prior abnormal imaging. Recommended MRCP while he is feeling well to ensure no small lesion in his pancreas that could have caused his prior presentation... per pt repeat MRCP was normal.  Diabetes: He is on Ozempic 1 mg every 14 days ( not on weekly given ? SBO issues), on Synjardy XR 25/2000 mg daily, glimperide 4 mg daily Last check  A1C per pt around 7.5 ish. He has lost weight with ozempic.. 30 lbs in last year. Using medications without difficulties: Hypoglycemic episodes: Hyperglycemic episodes: Feet problems: none Blood Sugars averaging: not checking eye exam within last  year: due  Elevated Cholesterol: On Crestor 20 mg daily Lab Results  Component Value Date   CHOL 136 08/15/2012   HDL 27 (L) 08/15/2012   LDLCALC 86 08/15/2012   TRIG 116 08/15/2012   CHOLHDL 5.0 08/15/2012  Using medications without problems: Muscle aches:  Diet compliance: Exercise: Other complaints:  Hypertension:  Well-controlled on amlodipine 5 mg p.o. daily, benazepril 40 mg daily BP Readings from Last 3 Encounters:  09/13/22 102/62  05/09/22 122/70  04/15/22 128/79  Using medication without problems or lightheadedness:  none Chest pain with exertion: none Edema: none Short of breath: none Average home BPs: Other issues:   Urology... PSA followed... usually in 12/2022.     Relevant past medical, surgical, family and social history reviewed and updated as indicated. Interim medical history since our last visit reviewed. Allergies and medications reviewed and updated. Outpatient Medications Prior to Visit  Medication Sig Dispense Refill   amLODipine (NORVASC) 5 MG tablet Take 5 mg by mouth in the morning.     benazepril (LOTENSIN) 40 MG tablet Take 40 mg by mouth daily.     glimepiride (AMARYL) 4 MG tablet Take 4 mg by mouth daily with breakfast.     OZEMPIC, 1 MG/DOSE, 4 MG/3ML SOPN Inject 1 mg into the skin every 14 (fourteen) days.     rosuvastatin (CRESTOR) 20 MG tablet Take 20 mg by mouth at bedtime.     SYNJARDY XR 25-1000 MG TB24 Take 1 tablet by mouth daily.  tadalafil (CIALIS) 5 MG tablet Take 5 mg by mouth daily.     tiZANidine (ZANAFLEX) 2 MG tablet Take 2 mg by mouth at bedtime as needed.     Semaglutide,0.25 or 0.5MG /DOS, 2 MG/1.5ML SOPN Inject 1 mg into the skin once a week. Friday/Saturday     No facility-administered medications prior to visit.     Per HPI unless specifically indicated in ROS section below Review of Systems  Constitutional:  Negative for fatigue and fever.  HENT:  Negative for ear pain.   Eyes:  Negative for pain.   Respiratory:  Negative for cough and shortness of breath.   Cardiovascular:  Negative for chest pain, palpitations and leg swelling.  Gastrointestinal:  Negative for abdominal pain.  Genitourinary:  Negative for dysuria.  Musculoskeletal:  Negative for arthralgias.  Neurological:  Negative for syncope, light-headedness and headaches.  Psychiatric/Behavioral:  Negative for dysphoric mood.    Objective:  BP 102/62 (BP Location: Left Arm, Patient Position: Sitting, Cuff Size: Large)   Pulse 64   Temp (!) 97.4 F (36.3 C) (Temporal)   Ht 6' 1.5" (1.867 m)   Wt 295 lb 4 oz (133.9 kg)   SpO2 98%   BMI 38.43 kg/m   Wt Readings from Last 3 Encounters:  09/13/22 295 lb 4 oz (133.9 kg)  05/09/22 295 lb 6 oz (134 kg)  04/15/22 296 lb 1.3 oz (134.3 kg)      Physical Exam Constitutional:      Appearance: He is well-developed. He is obese.  HENT:     Head: Normocephalic.     Right Ear: Hearing normal.     Left Ear: Hearing normal.     Nose: Nose normal.  Neck:     Thyroid: No thyroid mass or thyromegaly.     Vascular: No carotid bruit.     Trachea: Trachea normal.  Cardiovascular:     Rate and Rhythm: Normal rate and regular rhythm.     Pulses: Normal pulses.     Heart sounds: Heart sounds not distant. No murmur heard.    No friction rub. No gallop.     Comments: No peripheral edema Pulmonary:     Effort: Pulmonary effort is normal. No respiratory distress.     Breath sounds: Normal breath sounds.  Skin:    General: Skin is warm and dry.     Findings: No rash.  Psychiatric:        Speech: Speech normal.        Behavior: Behavior normal.        Thought Content: Thought content normal.       Results for orders placed or performed during the hospital encounter of 04/15/22  Glucose, capillary  Result Value Ref Range   Glucose-Capillary 162 (H) 70 - 99 mg/dL  Surgical pathology  Result Value Ref Range   SURGICAL PATHOLOGY      SURGICAL PATHOLOGY CASE:  [id number] PATIENT: Jerry Hart Surgical Pathology Report     Clinical History: colon cancer screening (cm)     FINAL MICROSCOPIC DIAGNOSIS:  A.   COLON, CECUM, POLYPECTOMY: - Sessile serrated polyp. - No dysplasia or malignancy.  B.   COLON, SIGMOID, POLYPECTOMY: - Tubular adenoma. - No high grade dysplasia or malignancy.    GROSS DESCRIPTION:  A: Received in formalin are tan, soft tissue fragments that are submitted in toto. Number: 3 size: 0.2-0.3 cm blocks: 1  B: Received in formalin is a 1 cm polypoid portion of tan mucosa.  The  specimen is inked, sectioned and entirely submitted in 1 cassette.  [facility] 04/15/2022)   Final Diagnosis performed by Clifton James, MD.   Electronically signed 04/16/2022 Technical component performed at [facility]. [facility], [address] [place], [place] [address].  Professional component performed at [facility], [address] [place], [place] [address].   Immunohistochemistry Technical component (if applicable) was performed at [facility]. [address], [place], [place] [address].   IMMUNOHISTOCHEMISTRY DISCLAIMER (if applicable): Some of these immunohistochemical stains may have been developed and the performance characteristics determine by [facility]. Some may not have been cleared or approved by the U.S. Food and Drug Administration. The FDA has determined that such clearance or approval is not necessary. This test is used for clinical purposes. It should not be regarded as investigational or for research. This laboratory is certified under the Clinical Laboratory Improvement Amendments of 1988 (CLIA-88) as qualified to perform high complexity clinical laboratory testing.  The controls stained appropriately.     Assessment and Plan  Type 2 diabetes mellitus with other circulatory complication, without long-term current use of insulin  (HCC) Assessment & Plan:  Due for re-eval   Ozempic 1 mg every 14 days ( not on weekly given ? SBO issues), on Synjardy XR 25/2000 mg daily, glimperide 4 mg daily  Orders: -     Hemoglobin A1c; Future -     Lipid panel; Future -     Comprehensive metabolic panel; Future -     Microalbumin / creatinine urine ratio; Future  Hypertension associated with diabetes (HCC) Assessment & Plan: Stable, chronic.  Continue current medication.   Well-controlled on amlodipine 5 mg p.o. daily, benazepril 40 mg daily   Hypercholesterolemia Assessment & Plan:  Due for re-eval. On crestor 20 mg daily.   SBO (small bowel obstruction) (HCC) Assessment & Plan:  2022, unclear cause. February follow-up Dr. Adela Hart recommended repeat imaging of pancreas given mother with history of pancreatic cancer and prior abnormal imaging/SBO Recommended MRCP while he is feeling well to ensure no small lesion in his pancreas that could have caused his prior presentation... per pt repeat MRCP was normal.   Drug-induced erectile dysfunction Assessment & Plan:  Chronic.Marland Kitchen well controlled with cialis  5 mg daily.   Acute upper back pain Assessment & Plan:  Using tizanadine as need for pain .Marland Kitchen Helps some.   Need for hepatitis C screening test -     Hepatitis C antibody; Future    Return in about 3 months (around 12/14/2022) for anuual physical with fasting labs prior.   Kerby Nora, MD

## 2022-09-14 LAB — HEPATITIS C ANTIBODY: Hepatitis C Ab: NONREACTIVE

## 2022-10-02 IMAGING — CT CT ABD-PELV W/ CM
2 of 5 series · 16 of 46 positions shown, 18 images · IV contrast (OMNIPAQUE 300)
Comparison: CT abdomen and pelvis 10/17/2020

CLINICAL DATA: Recent bowel obstruction.  Abdominal pain.

EXAM:
CT ABDOMEN AND PELVIS WITH CONTRAST
TECHNIQUE: Multidetector CT imaging of the abdomen and pelvis was performed
using the standard protocol following bolus administration of
intravenous contrast.

[Series 2: abd/pel w · axial · 0.86mm/px · z∈[-512,-42]mm · 13 of 106 slices shown, 15 images]
[im 6/106  soft-tissue]
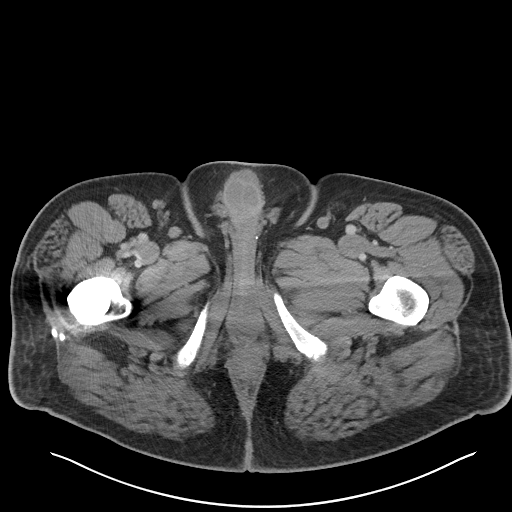
[im 6/106  bone]
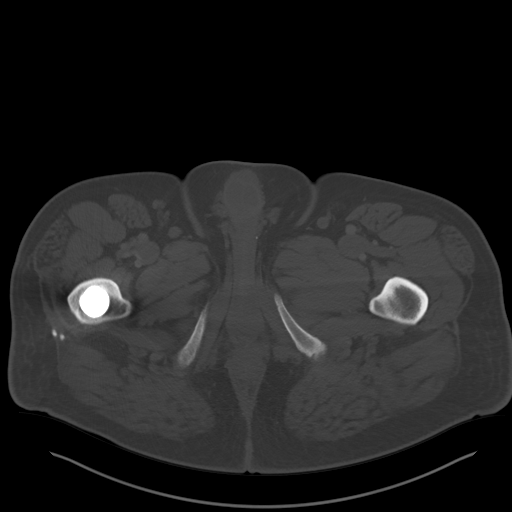
[im 12/106  soft-tissue]
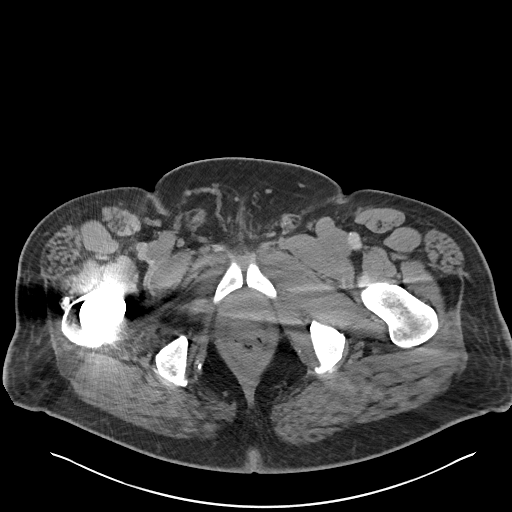
[im 24/106  soft-tissue]
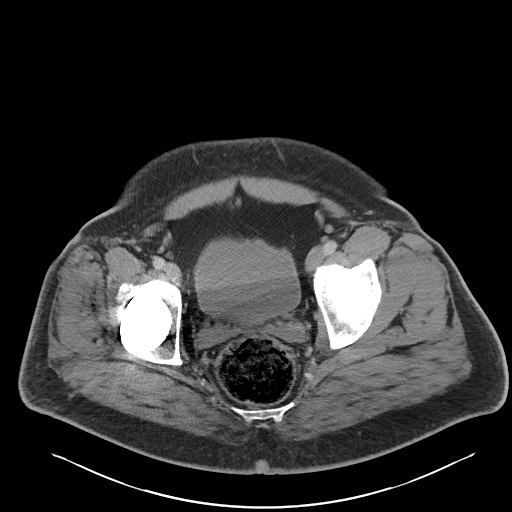
[im 30/106  soft-tissue]
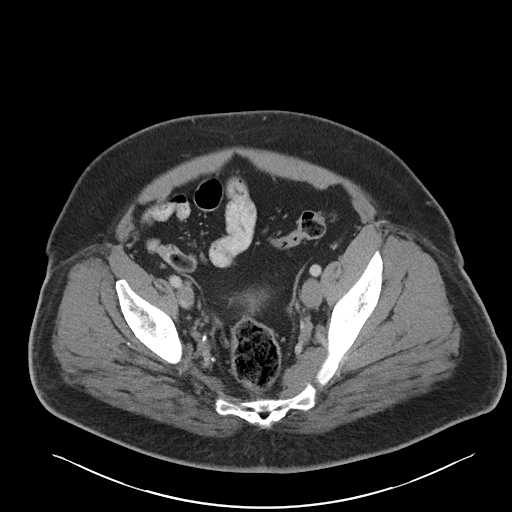
[im 36/106  soft-tissue]
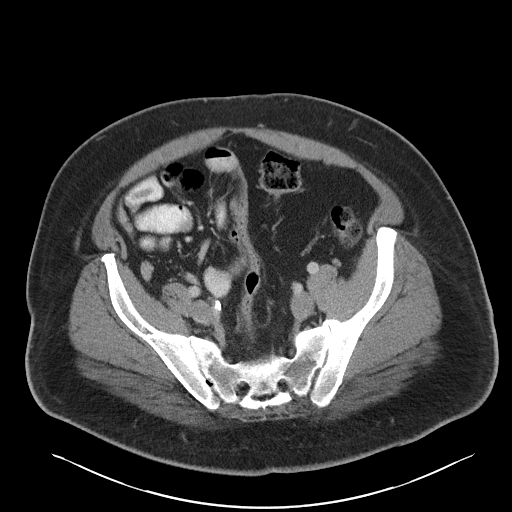
[im 47/106  soft-tissue]
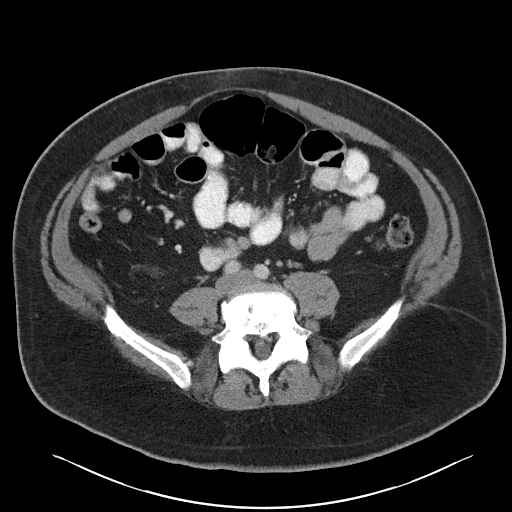
[im 53/106  soft-tissue]
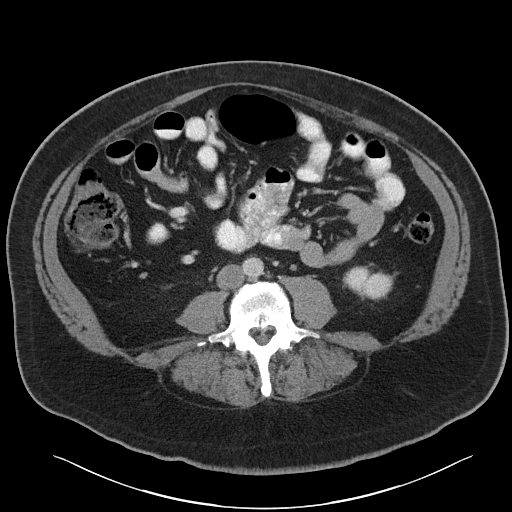
[im 59/106  soft-tissue]
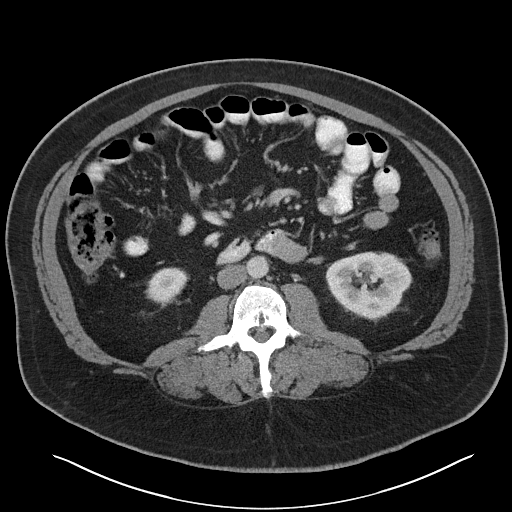
[im 71/106  soft-tissue]
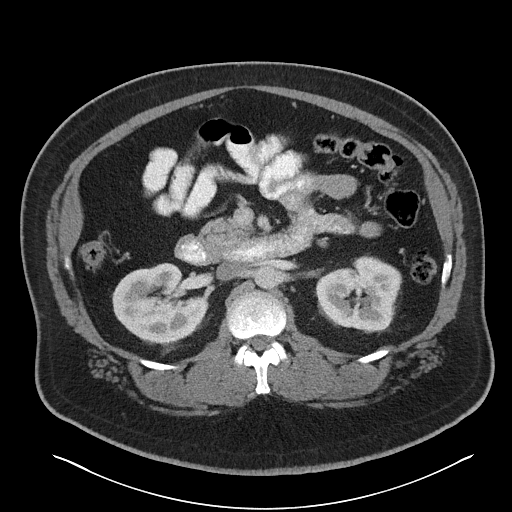
[im 71/106  bone]
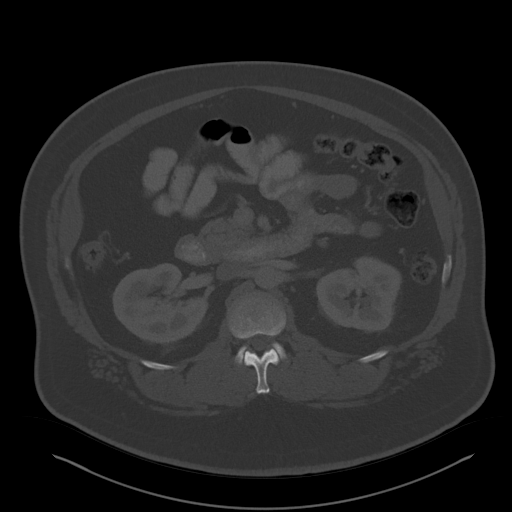
[im 76/106  soft-tissue]
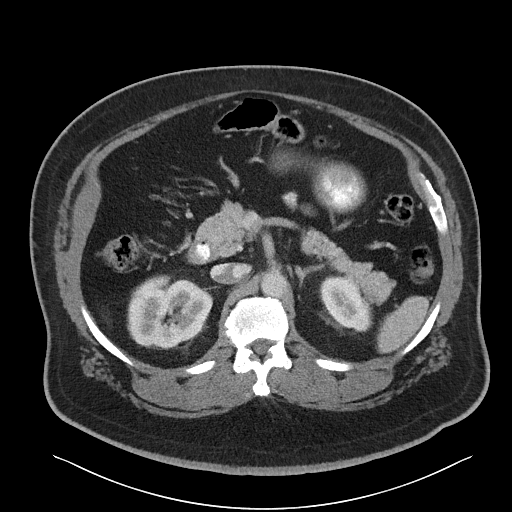
[im 82/106  soft-tissue]
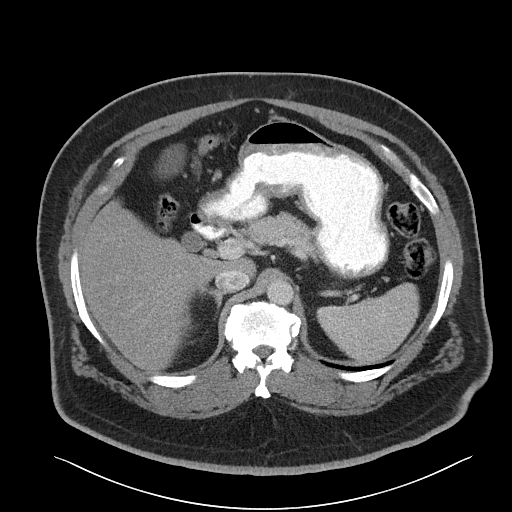
[im 94/106  soft-tissue]
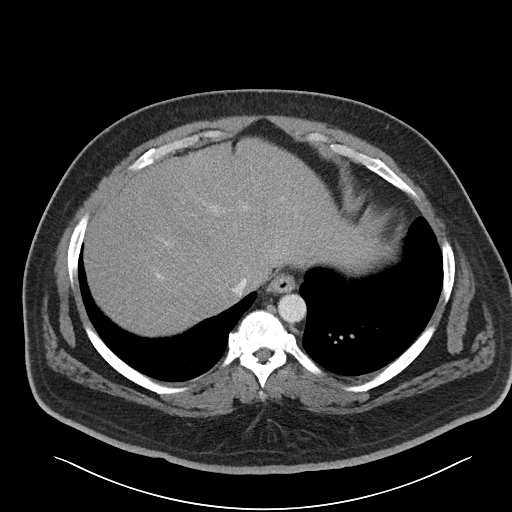
[im 100/106  soft-tissue]
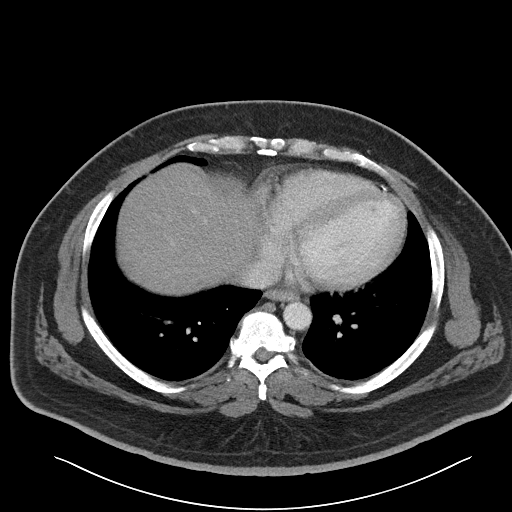

[Series 5: coronal st · coronal · 0.78mm/px · 3 of 114 slices shown]
[im 38/114  soft-tissue]
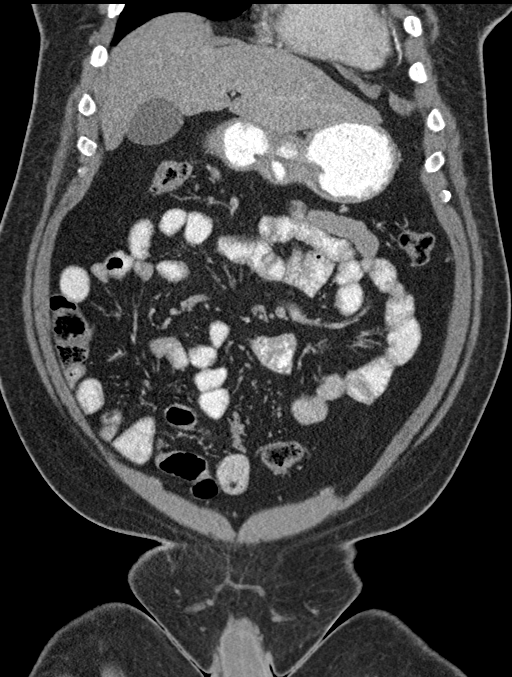
[im 51/114  soft-tissue]
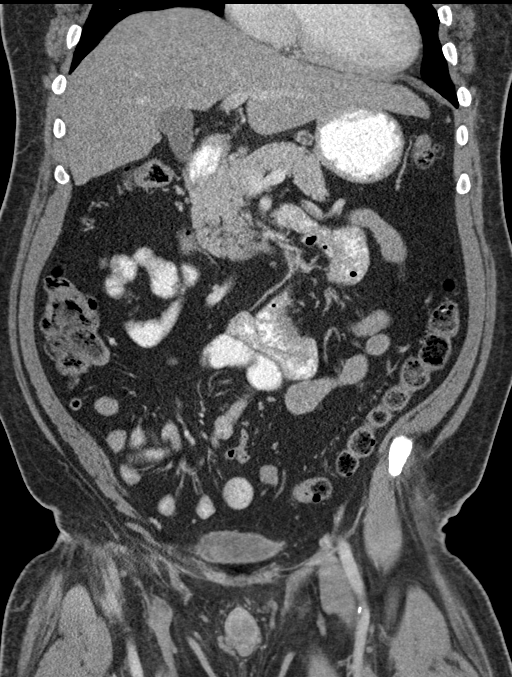
[im 63/114  soft-tissue]
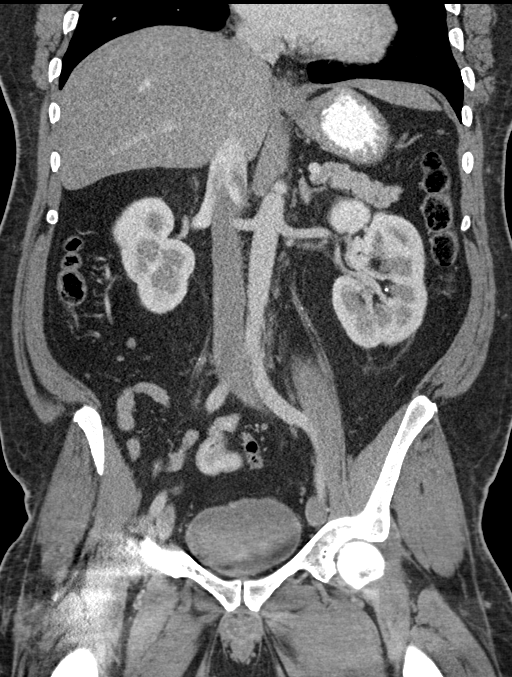

[16 of 46 positions shown; findings below may reference images not displayed]

RADIATION DOSE REDUCTION: This exam was performed according to the
departmental dose-optimization program which includes automated
exposure control, adjustment of the mA and/or kV according to
patient size and/or use of iterative reconstruction technique.

CONTRAST:  100mL OMNIPAQUE IOHEXOL 300 MG/ML  SOLN
FINDINGS: Lower chest: Stable 5 mm ground-glass nodule in the left lower lobe.

Hepatobiliary: Liver is normal in size and contour with no
suspicious mass identified. Hypodense appearance of the parenchyma
suggesting hepatic steatosis. Gallbladder appears normal. No biliary
ductal dilatation.

Pancreas: Unremarkable. No pancreatic ductal dilatation or
surrounding inflammatory changes.

Spleen: Normal in size without focal abnormality.

Adrenals/Urinary Tract: Adrenal glands appear normal. 6 mm calculus
in the upper pole right kidney. No hydronephrosis or enhancing renal
mass identified bilaterally. Urinary bladder appears normal.

Stomach/Bowel: No bowel obstruction, free air or pneumatosis. Mild
retained fecal material in the colon and rectum. No bowel wall edema
identified. Appendix is normal.

Vascular/Lymphatic: No significant vascular findings are present. No
enlarged abdominal or pelvic lymph nodes.

Reproductive: Prostate is unremarkable.

Other: No ascites.

Musculoskeletal: Degenerative changes of the lumbar spine. No
suspicious bony lesions identified
IMPRESSION: 1. No acute abnormality identified in the abdomen or pelvis.
2. Right renal calculus.
3. Suggestion of hepatic steatosis.

## 2022-11-14 ENCOUNTER — Encounter (INDEPENDENT_AMBULATORY_CARE_PROVIDER_SITE_OTHER): Payer: Self-pay

## 2022-11-27 ENCOUNTER — Telehealth: Payer: Self-pay | Admitting: *Deleted

## 2022-11-27 DIAGNOSIS — E1159 Type 2 diabetes mellitus with other circulatory complications: Secondary | ICD-10-CM

## 2022-11-27 DIAGNOSIS — E7800 Pure hypercholesterolemia, unspecified: Secondary | ICD-10-CM

## 2022-11-27 DIAGNOSIS — Z125 Encounter for screening for malignant neoplasm of prostate: Secondary | ICD-10-CM

## 2022-11-27 NOTE — Telephone Encounter (Signed)
-----   Message from Vincenza Hews sent at 11/26/2022  1:59 PM EDT ----- Hello,   Patient has a lab appointment on 12/13/22. Can we get lab orders please.   Thanks

## 2022-12-03 LAB — HM DIABETES EYE EXAM

## 2022-12-13 ENCOUNTER — Other Ambulatory Visit: Payer: Managed Care, Other (non HMO)

## 2022-12-16 ENCOUNTER — Other Ambulatory Visit: Payer: Managed Care, Other (non HMO)

## 2022-12-16 DIAGNOSIS — Z125 Encounter for screening for malignant neoplasm of prostate: Secondary | ICD-10-CM

## 2022-12-16 DIAGNOSIS — E1159 Type 2 diabetes mellitus with other circulatory complications: Secondary | ICD-10-CM

## 2022-12-16 DIAGNOSIS — E7800 Pure hypercholesterolemia, unspecified: Secondary | ICD-10-CM | POA: Diagnosis not present

## 2022-12-16 LAB — COMPREHENSIVE METABOLIC PANEL
ALT: 51 U/L (ref 0–53)
AST: 39 U/L — ABNORMAL HIGH (ref 0–37)
Albumin: 3.9 g/dL (ref 3.5–5.2)
Alkaline Phosphatase: 106 U/L (ref 39–117)
BUN: 14 mg/dL (ref 6–23)
CO2: 26 meq/L (ref 19–32)
Calcium: 8.8 mg/dL (ref 8.4–10.5)
Chloride: 101 meq/L (ref 96–112)
Creatinine, Ser: 0.93 mg/dL (ref 0.40–1.50)
GFR: 95.77 mL/min (ref >60.00)
Glucose, Bld: 228 mg/dL — ABNORMAL HIGH (ref 70–99)
Potassium: 4.1 meq/L (ref 3.5–5.1)
Sodium: 134 mEq/L — ABNORMAL LOW (ref 135–145)
Total Bilirubin: 0.5 mg/dL (ref 0.2–1.2)
Total Protein: 7 g/dL (ref 6.0–8.3)

## 2022-12-16 LAB — LIPID PANEL
Cholesterol: 171 mg/dL (ref 0–200)
HDL: 32.7 mg/dL — ABNORMAL LOW (ref >39.00)
LDL Cholesterol: 112 mg/dL — ABNORMAL HIGH (ref 0–99)
NonHDL: 138.12
Total CHOL/HDL Ratio: 5
Triglycerides: 133 mg/dL (ref 0.0–149.0)
VLDL: 26.6 mg/dL (ref 0.0–40.0)

## 2022-12-16 LAB — PSA: PSA: 0.38 ng/mL (ref 0.10–4.00)

## 2022-12-16 LAB — HEMOGLOBIN A1C: Hgb A1c MFr Bld: 8.5 % — ABNORMAL HIGH (ref 4.6–6.5)

## 2022-12-20 ENCOUNTER — Ambulatory Visit (INDEPENDENT_AMBULATORY_CARE_PROVIDER_SITE_OTHER): Payer: Managed Care, Other (non HMO) | Admitting: Family Medicine

## 2022-12-20 ENCOUNTER — Encounter: Payer: Self-pay | Admitting: Family Medicine

## 2022-12-20 VITALS — BP 118/68 | HR 88 | Temp 97.9°F | Ht 73.5 in | Wt 305.4 lb

## 2022-12-20 DIAGNOSIS — E1169 Type 2 diabetes mellitus with other specified complication: Secondary | ICD-10-CM

## 2022-12-20 DIAGNOSIS — Z7985 Long-term (current) use of injectable non-insulin antidiabetic drugs: Secondary | ICD-10-CM

## 2022-12-20 DIAGNOSIS — I152 Hypertension secondary to endocrine disorders: Secondary | ICD-10-CM

## 2022-12-20 DIAGNOSIS — Z23 Encounter for immunization: Secondary | ICD-10-CM | POA: Diagnosis not present

## 2022-12-20 DIAGNOSIS — Z6838 Body mass index (BMI) 38.0-38.9, adult: Secondary | ICD-10-CM

## 2022-12-20 DIAGNOSIS — Z0000 Encounter for general adult medical examination without abnormal findings: Secondary | ICD-10-CM

## 2022-12-20 DIAGNOSIS — E1159 Type 2 diabetes mellitus with other circulatory complications: Secondary | ICD-10-CM | POA: Diagnosis not present

## 2022-12-20 DIAGNOSIS — E785 Hyperlipidemia, unspecified: Secondary | ICD-10-CM

## 2022-12-20 MED ORDER — OZEMPIC (1 MG/DOSE) 4 MG/3ML ~~LOC~~ SOPN: 1.0000 mg | PEN_INJECTOR | SUBCUTANEOUS | 3 refills | Status: DC

## 2022-12-20 NOTE — Patient Instructions (Addendum)
Work on low carbohydrate low cholesterol diet.  Restart crestor daily.  Call if interested in continuous glucose monitor.

## 2022-12-20 NOTE — Assessment & Plan Note (Signed)
Chronic, unable to tolerate higher dose of Ozempic.  Continue current regimen.  Encouraged him to work on healthy eating habits, regular exercise and weight management.  He is not interested in a referral to nutritionist.

## 2022-12-20 NOTE — Assessment & Plan Note (Signed)
Stable, chronic.  Continue current medication.   Well-controlled on amlodipine 5 mg p.o. daily, benazepril 40 mg daily

## 2022-12-20 NOTE — Assessment & Plan Note (Signed)
Chronic, inadequate control but he has stopped Crestor in the last few months.  Previously very well-controlled on Crestor 20 mg daily.  He will restart this.  Reevaluate in 3 months.

## 2022-12-20 NOTE — Progress Notes (Signed)
Patient ID: Jerry Hart, male    DOB: 11-03-1970, 52 y.o.   MRN: [id number]  This visit was conducted in person.  BP 118/68 (BP Location: Left Arm, Patient Position: Sitting, Cuff Size: Large)   Pulse 88   Temp 97.9 F (36.6 C) (Temporal)   Ht 6' 1.5" (1.867 m)   Wt (!) 305 lb 6 oz (138.5 kg)   SpO2 96%   BMI 39.74 kg/m    CC:  Chief Complaint  Patient presents with   Annual Exam    Subjective:   HPI: Jerry Hart is a 52 y.o. male presenting on 12/20/2022 for Annual Exam  The patient presents for  complete physical and review of chronic health problems. He/She also has the following acute concerns today:   Diabetes: He is on Ozempic 1 mg every 14 days ( not on weekly given ? SBO issues), on Synjardy XR 25/2000 mg daily, glimperide 4 mg daily He has lost weight with ozempic.. 30 lbs in last year. Lab Results  Component Value Date   HGBA1C 8.5 (H) 12/16/2022  Using medications without difficulties: Hypoglycemic episodes: Hyperglycemic episodes: Feet problems: none Blood Sugars averaging: not checking eye exam within last year: due  Wt Readings from Last 3 Encounters:  12/20/22 (!) 305 lb 6 oz (138.5 kg)  09/13/22 295 lb 4 oz (133.9 kg)  05/09/22 295 lb 6 oz (134 kg)     Elevated Cholesterol: On Crestor 20 mg daily in past but stopped Lab Results  Component Value Date   CHOL 171 12/16/2022   HDL 32.70 (L) 12/16/2022   LDLCALC 112 (H) 12/16/2022   TRIG 133.0 12/16/2022   CHOLHDL 5 12/16/2022  Using medications without problems: Muscle aches:  Diet compliance: Exercise: Other complaints:  Hypertension:  Well-controlled on amlodipine 5 mg p.o. daily, benazepril 40 mg daily BP Readings from Last 3 Encounters:  12/20/22 118/68  09/13/22 102/62  05/09/22 122/70  Using medication without problems or lightheadedness:  none Chest pain with exertion: none Edema: none Short of breath: none Average home BPs: Other issues:   Urology... PSA followed...  usually in 12/2022.     Relevant past medical, surgical, family and social history reviewed and updated as indicated. Interim medical history since our last visit reviewed. Allergies and medications reviewed and updated. Outpatient Medications Prior to Visit  Medication Sig Dispense Refill   amLODipine (NORVASC) 5 MG tablet Take 5 mg by mouth in the morning.     benazepril (LOTENSIN) 40 MG tablet Take 40 mg by mouth daily.     glimepiride (AMARYL) 4 MG tablet Take 4 mg by mouth daily with breakfast.     rosuvastatin (CRESTOR) 20 MG tablet Take 20 mg by mouth at bedtime.     SYNJARDY XR 25-1000 MG TB24 Take 1 tablet by mouth daily.     tadalafil (CIALIS) 5 MG tablet Take 5 mg by mouth daily.     tiZANidine (ZANAFLEX) 2 MG tablet Take 2 mg by mouth at bedtime as needed.     OZEMPIC, 1 MG/DOSE, 4 MG/3ML SOPN Inject 1 mg into the skin every 14 (fourteen) days.     No facility-administered medications prior to visit.     Per HPI unless specifically indicated in ROS section below Review of Systems  Constitutional:  Negative for fatigue and fever.  HENT:  Negative for ear pain.   Eyes:  Negative for pain.  Respiratory:  Negative for cough and shortness of breath.   Cardiovascular:  Negative for chest pain, palpitations and leg swelling.  Gastrointestinal:  Negative for abdominal pain.  Genitourinary:  Negative for dysuria.  Musculoskeletal:  Negative for arthralgias.  Neurological:  Negative for syncope, light-headedness and headaches.  Psychiatric/Behavioral:  Negative for dysphoric mood.    Objective:  BP 118/68 (BP Location: Left Arm, Patient Position: Sitting, Cuff Size: Large)   Pulse 88   Temp 97.9 F (36.6 C) (Temporal)   Ht 6' 1.5" (1.867 m)   Wt (!) 305 lb 6 oz (138.5 kg)   SpO2 96%   BMI 39.74 kg/m   Wt Readings from Last 3 Encounters:  12/20/22 (!) 305 lb 6 oz (138.5 kg)  09/13/22 295 lb 4 oz (133.9 kg)  05/09/22 295 lb 6 oz (134 kg)      Physical  Exam Constitutional:      Appearance: He is well-developed. He is obese.  HENT:     Head: Normocephalic.     Right Ear: Hearing normal.     Left Ear: Hearing normal.     Nose: Nose normal.  Neck:     Thyroid: No thyroid mass or thyromegaly.     Vascular: No carotid bruit.     Trachea: Trachea normal.  Cardiovascular:     Rate and Rhythm: Normal rate and regular rhythm.     Pulses: Normal pulses.     Heart sounds: Heart sounds not distant. No murmur heard.    No friction rub. No gallop.     Comments: No peripheral edema Pulmonary:     Effort: Pulmonary effort is normal. No respiratory distress.     Breath sounds: Normal breath sounds.  Skin:    General: Skin is warm and dry.     Findings: No rash.  Psychiatric:        Speech: Speech normal.        Behavior: Behavior normal.        Thought Content: Thought content normal.       Results for orders placed or performed in visit on 12/16/22  PSA  Result Value Ref Range   PSA 0.38 0.10 - 4.00 ng/mL  Lipid panel  Result Value Ref Range   Cholesterol 171 0 - 200 mg/dL   Triglycerides 962.9 0.0 - 149.0 mg/dL   HDL 52.84 (L) >13.24 mg/dL   VLDL 40.1 0.0 - 02.7 mg/dL   LDL Cholesterol 253 (H) 0 - 99 mg/dL   Total CHOL/HDL Ratio 5    NonHDL 138.12   Hemoglobin A1c  Result Value Ref Range   Hgb A1c MFr Bld 8.5 (H) 4.6 - 6.5 %  Comprehensive metabolic panel  Result Value Ref Range   Sodium 134 (L) 135 - 145 mEq/L   Potassium 4.1 3.5 - 5.1 mEq/L   Chloride 101 96 - 112 mEq/L   CO2 26 19 - 32 mEq/L   Glucose, Bld 228 (H) 70 - 99 mg/dL   BUN 14 6 - 23 mg/dL   Creatinine, Ser 6.64 0.40 - 1.50 mg/dL   Total Bilirubin 0.5 0.2 - 1.2 mg/dL   Alkaline Phosphatase 106 39 - 117 U/L   AST 39 (H) 0 - 37 U/L   ALT 51 0 - 53 U/L   Total Protein 7.0 6.0 - 8.3 g/dL   Albumin 3.9 3.5 - 5.2 g/dL   GFR 40.34 >74.25 mL/min   Calcium 8.8 8.4 - 10.5 mg/dL    Assessment and Plan The patient's preventative maintenance and recommended  screening tests for an annual wellness  exam were reviewed in full today. Brought up to date unless services declined.  Counselled on the importance of diet, exercise, and its role in overall health and mortality. The patient's FH and SH was reviewed, including their home life, tobacco status, and drug and alcohol status.    Vaccines: Given Tdap, flu Prostate Cancer Screen: Colon Cancer Screen:       Smoking Status:none ETOH/ drug use:  beer/ none  Hep C:  none  HIV screen:   none  Routine general medical examination at a health care facility  Need for influenza vaccination -     Flu vaccine trivalent PF, 6mos and older(Flulaval,Afluria,Fluarix,Fluzone)  Need for Tdap vaccination -     Tdap vaccine greater than or equal to 7yo IM  Type 2 diabetes mellitus with other circulatory complication, without long-term current use of insulin (HCC) Assessment & Plan: Chronic, worsened control on current regimen.  He states he has not been paying as much attention to his eating habits and exercise as he has been on vacation within the last 3 months.  He will get back on track with healthy eating habits and we will reevaluate in 3 months.  Ozempic 1 mg every 14 days ( not on weekly given ? SBO issues), on Synjardy XR 25/2000 mg daily, glimperide 4 mg daily    Hyperlipidemia associated with type 2 diabetes mellitus (HCC) Assessment & Plan: Chronic, inadequate control but he has stopped Crestor in the last few months.  Previously very well-controlled on Crestor 20 mg daily.  He will restart this.  Reevaluate in 3 months.   Class 2 severe obesity due to excess calories with serious comorbidity and body mass index (BMI) of 38.0 to 38.9 in adult [facility]) Assessment & Plan: Chronic, unable to tolerate higher dose of Ozempic.  Continue current regimen.  Encouraged him to work on healthy eating habits, regular exercise and weight management.  He is not interested in a referral to  nutritionist.   Hypertension associated with diabetes (HCC) Assessment & Plan: Stable, chronic.  Continue current medication.   Well-controlled on amlodipine 5 mg p.o. daily, benazepril 40 mg daily   Other orders -     Ozempic (1 MG/DOSE); Inject 1 mg into the skin every 14 (fourteen) days.  Dispense: 9 mL; Refill: 3    Return in about 3 months (around 03/21/2023) for diabetes follow up with fasting labs prior.   Kerby Nora, MD

## 2022-12-20 NOTE — Assessment & Plan Note (Signed)
Chronic, worsened control on current regimen.  He states he has not been paying as much attention to his eating habits and exercise as he has been on vacation within the last 3 months.  He will get back on track with healthy eating habits and we will reevaluate in 3 months.  Ozempic 1 mg every 14 days ( not on weekly given ? SBO issues), on Synjardy XR 25/2000 mg daily, glimperide 4 mg daily

## 2023-01-06 ENCOUNTER — Other Ambulatory Visit (HOSPITAL_COMMUNITY): Payer: Self-pay

## 2023-02-25 ENCOUNTER — Telehealth: Payer: Self-pay | Admitting: *Deleted

## 2023-02-25 DIAGNOSIS — E1169 Type 2 diabetes mellitus with other specified complication: Secondary | ICD-10-CM

## 2023-02-25 DIAGNOSIS — E1159 Type 2 diabetes mellitus with other circulatory complications: Secondary | ICD-10-CM

## 2023-02-25 NOTE — Telephone Encounter (Signed)
-----   Message from Alvina Chou sent at 02/25/2023  3:47 PM EST ----- Regarding: Lab orders for Fri, 12.13.24 Labs for dm, thanks

## 2023-02-28 ENCOUNTER — Telehealth: Payer: Self-pay

## 2023-02-28 ENCOUNTER — Other Ambulatory Visit (HOSPITAL_COMMUNITY): Payer: Self-pay

## 2023-02-28 NOTE — Telephone Encounter (Signed)
Pharmacy Patient Advocate Encounter   Received notification from CoverMyMeds that prior authorization for Ozempic (1 MG/DOSE) 4MG /3ML pen-injectors is required/requested.   Insurance verification completed.   The patient is insured through [facility] .   Per test claim: The current 28 day co-pay is, $24.99.  No PA needed at this time. This test claim was processed through [facility]- copay amounts may vary at other pharmacies due to pharmacy/plan contracts, or as the patient moves through the different stages of their insurance plan.

## 2023-03-10 ENCOUNTER — Telehealth: Payer: Self-pay | Admitting: Family Medicine

## 2023-03-10 MED ORDER — GLIMEPIRIDE 4 MG PO TABS: 4.0000 mg | ORAL_TABLET | Freq: Every day | ORAL | 0 refills | Status: DC

## 2023-03-10 MED ORDER — SYNJARDY XR 25-1000 MG PO TB24: 1.0000 | ORAL_TABLET | Freq: Every day | ORAL | 0 refills | Status: DC

## 2023-03-10 NOTE — Telephone Encounter (Signed)
Refills sent as requested

## 2023-03-10 NOTE — Telephone Encounter (Signed)
Prescription Request  03/10/2023  LOV: 12/20/2022  What is the name of the medication or equipment? glimepiride (AMARYL) 4 MG tablet  SYNJARDY XR 25-1000 MG TB24   Have you contacted your pharmacy to request a refill? No   Which pharmacy would you like this sent to?  [id number] [place] [place] - [address] AT [facility] OF [address]  [place] [address] Phone: [phone] Fax: [phone]    Patient notified that their request is being sent to the clinical staff for review and that they should receive a response within 2 business days.   Please advise at Mobile [phone] (mobile)

## 2023-03-14 ENCOUNTER — Other Ambulatory Visit (INDEPENDENT_AMBULATORY_CARE_PROVIDER_SITE_OTHER): Payer: Managed Care, Other (non HMO)

## 2023-03-14 DIAGNOSIS — E785 Hyperlipidemia, unspecified: Secondary | ICD-10-CM

## 2023-03-14 DIAGNOSIS — E1159 Type 2 diabetes mellitus with other circulatory complications: Secondary | ICD-10-CM

## 2023-03-14 DIAGNOSIS — E1169 Type 2 diabetes mellitus with other specified complication: Secondary | ICD-10-CM | POA: Diagnosis not present

## 2023-03-14 LAB — COMPREHENSIVE METABOLIC PANEL
ALT: 23 U/L (ref 0–53)
AST: 20 U/L (ref 0–37)
Albumin: 4.3 g/dL (ref 3.5–5.2)
Alkaline Phosphatase: 90 U/L (ref 39–117)
BUN: 17 mg/dL (ref 6–23)
CO2: 28 meq/L (ref 19–32)
Calcium: 9.5 mg/dL (ref 8.4–10.5)
Chloride: 99 meq/L (ref 96–112)
Creatinine, Ser: 0.92 mg/dL (ref 0.40–1.50)
GFR: 96.86 mL/min (ref >60.00)
Glucose, Bld: 126 mg/dL — ABNORMAL HIGH (ref 70–99)
Potassium: 4.2 meq/L (ref 3.5–5.1)
Sodium: 136 meq/L (ref 135–145)
Total Bilirubin: 0.4 mg/dL (ref 0.2–1.2)
Total Protein: 7.7 g/dL (ref 6.0–8.3)

## 2023-03-14 LAB — LIPID PANEL
Cholesterol: 167 mg/dL (ref 0–200)
HDL: 31.6 mg/dL — ABNORMAL LOW (ref >39.00)
LDL Cholesterol: 111 mg/dL — ABNORMAL HIGH (ref 0–99)
NonHDL: 135.69
Total CHOL/HDL Ratio: 5
Triglycerides: 121 mg/dL (ref 0.0–149.0)
VLDL: 24.2 mg/dL (ref 0.0–40.0)

## 2023-03-14 LAB — HEMOGLOBIN A1C: Hgb A1c MFr Bld: 9 % — ABNORMAL HIGH (ref 4.6–6.5)

## 2023-03-14 NOTE — Progress Notes (Signed)
No critical labs need to be addressed urgently. We will discuss labs in detail at upcoming office visit.   

## 2023-03-21 ENCOUNTER — Ambulatory Visit: Payer: Managed Care, Other (non HMO) | Admitting: Family Medicine

## 2023-03-21 VITALS — BP 120/74 | HR 84 | Temp 97.6°F | Ht 73.5 in | Wt 308.2 lb

## 2023-03-21 DIAGNOSIS — E785 Hyperlipidemia, unspecified: Secondary | ICD-10-CM | POA: Diagnosis not present

## 2023-03-21 DIAGNOSIS — I152 Hypertension secondary to endocrine disorders: Secondary | ICD-10-CM

## 2023-03-21 DIAGNOSIS — E1169 Type 2 diabetes mellitus with other specified complication: Secondary | ICD-10-CM

## 2023-03-21 DIAGNOSIS — E1159 Type 2 diabetes mellitus with other circulatory complications: Secondary | ICD-10-CM | POA: Diagnosis not present

## 2023-03-21 DIAGNOSIS — Z7985 Long-term (current) use of injectable non-insulin antidiabetic drugs: Secondary | ICD-10-CM

## 2023-03-21 MED ORDER — GLIMEPIRIDE 4 MG PO TABS: 8.0000 mg | ORAL_TABLET | Freq: Every day | ORAL | Status: DC

## 2023-03-21 MED ORDER — DEXCOM G7 RECEIVER DEVI: 11 refills | Status: AC

## 2023-03-21 MED ORDER — DEXCOM G7 SENSOR MISC: 11 refills | Status: AC

## 2023-03-21 NOTE — Assessment & Plan Note (Addendum)
Chronic, worsened control on current regimen. Will have him try to get back on track with healthy eating habits and start increased regular exercise. Will increase Amaryl to maximum dose 8 mg daily If diabetes control not improving at next check consider referral to endocrinology.  Ozempic 1 mg every 14 days ( not on weekly given ? SBO issues), on Synjardy XR 25/2000 mg daily, glimperide 8 mg daily

## 2023-03-21 NOTE — Patient Instructions (Addendum)
Work on low carbohydrate diet, increase exercise as able.  Increase glimepiride ( Amaryl) to 2 tabs daily.  Follow blood sugar more closely on Dexcom continuous glucose monitor.  Restart Crestor 20 mg daily... if any muscle ache.. can try CO Q10 for side effect.

## 2023-03-21 NOTE — Assessment & Plan Note (Signed)
Chronic, inadequate control, 2024 LDL goal less than 70 with diabetes  Restart Crestor 20 mg p.o. daily.  Discussed using coenzyme every 10 if body ache starts.  I do not think the left shoulder pain had anything to do with the cholesterol medication.

## 2023-03-21 NOTE — Assessment & Plan Note (Signed)
Stable, chronic.  Continue current medication.   Well-controlled on amlodipine 5 mg p.o. daily, benazepril 40 mg daily

## 2023-03-21 NOTE — Progress Notes (Signed)
Patient ID: Jerry Hart, male    DOB: [birth year], 52 y.o.   MRN: [id number]  This visit was conducted in person.  BP 120/74 (BP Location: Left Arm, Patient Position: Sitting, Cuff Size: Large)   Pulse 84   Temp 97.6 F (36.4 C) (Temporal)   Ht 6' 1.5" (1.867 m)   Wt (!) 308 lb 4 oz (139.8 kg)   SpO2 98%   BMI 40.12 kg/m    CC:  Chief Complaint  Patient presents with   Diabetes    Subjective:   HPI: Jerry Hart is a 52 y.o. male presenting on 03/21/2023 for Diabetes  Reviewed most recent office visit note from Dr. Adela Lank May 09, 2022  Noted in 2022 with small bowel obstruction, managed conservatively,  Follow-up CT scanning in March 2023 showed no active obstruction..  Still with surgical follow-up they held off on any operative intervention at that time.  In October 2023 when he had recurrent lower abdominal pain and back pain CT showed concerns for acute pancreatitis, nonobstructive renal stones and pleural effusions. Had normal liver and lipase levels.  Ultrasound showed no gallstones Colonoscopy January 24 small polyps, recommended repeat in 7 years  February follow-up Dr. Adela Lank recommended repeat imaging of pancreas given mother with history of pancreatic cancer and prior abnormal imaging. Recommended MRCP while he is feeling well to ensure no small lesion in his pancreas that could have caused his prior presentation... per pt repeat MRCP was normal.  Diabetes: He is on Ozempic 1 mg every 14 days ( not on weekly given ? SBO issues), on Synjardy XR 25/2000 mg daily, glimperide 4 mg daily Last check  A1C per pt around 7.5 ish. He has lost weight with ozempic.. 30 lbs in last year. .. Has gained some back.  He is  Poor control. Lab Results  Component Value Date   HGBA1C 9.0 (H) 03/14/2023  Using medications without difficulties: Hypoglycemic episodes: Hyperglycemic episodes: Feet problems: none Blood Sugars averaging: not checking eye exam  within last year: due  Wt Readings from Last 3 Encounters:  03/21/23 (!) 308 lb 4 oz (139.8 kg)  12/20/22 (!) 305 lb 6 oz (138.5 kg)  09/13/22 295 lb 4 oz (133.9 kg)     Elevated Cholesterol: On Crestor 20 mg daily ( stoipped this few months ago as he thought it was causing left shoulder pain.  Stopping this medication did not help shoulder pain resolved... LDL not at goal.   Lab Results  Component Value Date   CHOL 167 03/14/2023   HDL 31.60 (L) 03/14/2023   LDLCALC 111 (H) 03/14/2023   TRIG 121.0 03/14/2023   CHOLHDL 5 03/14/2023  Using medications without problems: Muscle aches:  Diet compliance: He has been trying to eat more grilled item. Exercise: walking daily, at work. Other complaints:  Hypertension:  Well-controlled on amlodipine 5 mg p.o. daily, benazepril 40 mg daily BP Readings from Last 3 Encounters:  03/21/23 120/74  12/20/22 118/68  09/13/22 102/62  Using medication without problems or lightheadedness:  none Chest pain with exertion: none Edema: none Short of breath: none Average home BPs: Other issues:    Relevant past medical, surgical, family and social history reviewed and updated as indicated. Interim medical history since our last visit reviewed. Allergies and medications reviewed and updated. Outpatient Medications Prior to Visit  Medication Sig Dispense Refill   amLODipine (NORVASC) 5 MG tablet Take 5 mg by mouth in the morning.     benazepril (  LOTENSIN) 40 MG tablet Take 40 mg by mouth daily.     OZEMPIC, 1 MG/DOSE, 4 MG/3ML SOPN Inject 1 mg into the skin every 14 (fourteen) days. 9 mL 3   rosuvastatin (CRESTOR) 20 MG tablet Take 20 mg by mouth at bedtime.     SYNJARDY XR 25-1000 MG TB24 Take 1 tablet by mouth daily. 90 tablet 0   tadalafil (CIALIS) 5 MG tablet Take 5 mg by mouth daily.     tiZANidine (ZANAFLEX) 2 MG tablet Take 2 mg by mouth at bedtime as needed.     glimepiride (AMARYL) 4 MG tablet Take 1 tablet (4 mg total) by mouth daily  with breakfast. 90 tablet 0   No facility-administered medications prior to visit.     Per HPI unless specifically indicated in ROS section below Review of Systems  Constitutional:  Negative for fatigue and fever.  HENT:  Negative for ear pain.   Eyes:  Negative for pain.  Respiratory:  Negative for cough and shortness of breath.   Cardiovascular:  Negative for chest pain, palpitations and leg swelling.  Gastrointestinal:  Negative for abdominal pain.  Genitourinary:  Negative for dysuria.  Musculoskeletal:  Negative for arthralgias.  Neurological:  Negative for syncope, light-headedness and headaches.  Psychiatric/Behavioral:  Negative for dysphoric mood.    Objective:  BP 120/74 (BP Location: Left Arm, Patient Position: Sitting, Cuff Size: Large)   Pulse 84   Temp 97.6 F (36.4 C) (Temporal)   Ht 6' 1.5" (1.867 m)   Wt (!) 308 lb 4 oz (139.8 kg)   SpO2 98%   BMI 40.12 kg/m   Wt Readings from Last 3 Encounters:  03/21/23 (!) 308 lb 4 oz (139.8 kg)  12/20/22 (!) 305 lb 6 oz (138.5 kg)  09/13/22 295 lb 4 oz (133.9 kg)      Physical Exam Constitutional:      Appearance: He is well-developed. He is obese.  HENT:     Head: Normocephalic.     Right Ear: Hearing normal.     Left Ear: Hearing normal.     Nose: Nose normal.  Neck:     Thyroid: No thyroid mass or thyromegaly.     Vascular: No carotid bruit.     Trachea: Trachea normal.  Cardiovascular:     Rate and Rhythm: Normal rate and regular rhythm.     Pulses: Normal pulses.     Heart sounds: Heart sounds not distant. No murmur heard.    No friction rub. No gallop.     Comments: No peripheral edema Pulmonary:     Effort: Pulmonary effort is normal. No respiratory distress.     Breath sounds: Normal breath sounds.  Skin:    General: Skin is warm and dry.     Findings: No rash.  Psychiatric:        Speech: Speech normal.        Behavior: Behavior normal.        Thought Content: Thought content normal.        Results for orders placed or performed in visit on 03/21/23  HM DIABETES EYE EXAM   Collection Time: 12/03/22 12:00 AM  Result Value Ref Range   HM Diabetic Eye Exam No Retinopathy No Retinopathy    Assessment and Plan  Type 2 diabetes mellitus with other circulatory complication, without long-term current use of insulin (HCC) Assessment & Plan: Chronic, worsened control on current regimen. Will have him try to get back on track with healthy  eating habits and start increased regular exercise. Will increase Amaryl to maximum dose 8 mg daily If diabetes control not improving at next check consider referral to endocrinology.  Ozempic 1 mg every 14 days ( not on weekly given ? SBO issues), on Synjardy XR 25/2000 mg daily, glimperide 8 mg daily    Hypertension associated with diabetes (HCC) Assessment & Plan: Stable, chronic.  Continue current medication.   Well-controlled on amlodipine 5 mg p.o. daily, benazepril 40 mg daily   Hyperlipidemia associated with type 2 diabetes mellitus (HCC) Assessment & Plan: Chronic, inadequate control, 2024 LDL goal less than 70 with diabetes  Restart Crestor 20 mg p.o. daily.  Discussed using coenzyme every 10 if body ache starts.  I do not think the left shoulder pain had anything to do with the cholesterol medication.   Other orders -     Glimepiride; Take 2 tablets (8 mg total) by mouth daily with breakfast. -     Dexcom G7 Sensor; Check blood sugar as needed  Dispense: 1 each; Refill: 11 -     Dexcom G7 Receiver; Check blood sugar as needed  Dispense: 1 each; Refill: 11     Return in about 3 months (around 06/19/2023) for diabetes follow up with fasting labs prior.   Kerby Nora, MD

## 2023-05-09 ENCOUNTER — Other Ambulatory Visit: Payer: Self-pay | Admitting: Family Medicine

## 2023-05-09 MED ORDER — GLIMEPIRIDE 4 MG PO TABS: 8.0000 mg | ORAL_TABLET | Freq: Every day | ORAL | 1 refills | Status: DC

## 2023-05-09 NOTE — Telephone Encounter (Signed)
 Refill sent as requested.

## 2023-05-09 NOTE — Telephone Encounter (Signed)
 Copied from CRM [id number]. Topic: Clinical - Medication Refill >> May 09, 2023 11:54 AM Jerry Hart wrote: Most Recent Primary Care Visit:  Provider: AVELINA NO E  Department: [facility]  Visit Type: OFFICE VISIT  Date: 03/21/2023  Medication: glimepiride  (AMARYL ) 4 MG tablet  Has the patient contacted their pharmacy? Yes (Agent: If no, request that the patient contact the pharmacy for the refill. If patient does not wish to contact the pharmacy document the reason why and proceed with request.) (Agent: If yes, when and what did the pharmacy advise?)  Is this the correct pharmacy for this prescription? Yes If no, delete pharmacy and type the correct one.  This is the patient's preferred pharmacy:  [id number] [place], [place] - [address] AT [facility] [address] [place] [address] Phone: [phone] Fax: [phone]     Has the prescription been filled recently? Yes  Is the patient out of the medication? Yes  Has the patient been seen for an appointment in the last year OR does the patient have an upcoming appointment? Yes  Can we respond through MyChart? Yes  Agent: Please be advised that Rx refills may take up to 3 business days. We ask that you follow-up with your pharmacy.

## 2023-05-26 ENCOUNTER — Telehealth: Payer: Self-pay | Admitting: *Deleted

## 2023-05-26 DIAGNOSIS — E1159 Type 2 diabetes mellitus with other circulatory complications: Secondary | ICD-10-CM

## 2023-05-26 DIAGNOSIS — E1169 Type 2 diabetes mellitus with other specified complication: Secondary | ICD-10-CM

## 2023-05-26 NOTE — Telephone Encounter (Signed)
-----   Message from Lovena Neighbours sent at 05/26/2023  2:18 PM EST ----- Regarding: Fasting labs for Friday 3.14.25 Please put lab orders in future. Thank you, Denny Peon

## 2023-06-13 ENCOUNTER — Other Ambulatory Visit: Payer: Managed Care, Other (non HMO)

## 2023-06-13 ENCOUNTER — Encounter: Payer: Self-pay | Admitting: Family Medicine

## 2023-06-13 DIAGNOSIS — E785 Hyperlipidemia, unspecified: Secondary | ICD-10-CM | POA: Diagnosis not present

## 2023-06-13 DIAGNOSIS — E1159 Type 2 diabetes mellitus with other circulatory complications: Secondary | ICD-10-CM | POA: Diagnosis not present

## 2023-06-13 DIAGNOSIS — E1169 Type 2 diabetes mellitus with other specified complication: Secondary | ICD-10-CM

## 2023-06-13 LAB — COMPREHENSIVE METABOLIC PANEL
ALT: 28 U/L (ref 0–53)
AST: 21 U/L (ref 0–37)
Albumin: 4.2 g/dL (ref 3.5–5.2)
Alkaline Phosphatase: 87 U/L (ref 39–117)
BUN: 16 mg/dL (ref 6–23)
CO2: 27 meq/L (ref 19–32)
Calcium: 9.5 mg/dL (ref 8.4–10.5)
Chloride: 105 meq/L (ref 96–112)
Creatinine, Ser: 0.95 mg/dL (ref 0.40–1.50)
GFR: 93.04 mL/min (ref >60.00)
Glucose, Bld: 97 mg/dL (ref 70–99)
Potassium: 3.9 meq/L (ref 3.5–5.1)
Sodium: 139 meq/L (ref 135–145)
Total Bilirubin: 0.4 mg/dL (ref 0.2–1.2)
Total Protein: 7.5 g/dL (ref 6.0–8.3)

## 2023-06-13 LAB — LIPID PANEL
Cholesterol: 167 mg/dL (ref 0–200)
HDL: 33.5 mg/dL — ABNORMAL LOW (ref >39.00)
LDL Cholesterol: 112 mg/dL — ABNORMAL HIGH (ref 0–99)
NonHDL: 133.71
Total CHOL/HDL Ratio: 5
Triglycerides: 108 mg/dL (ref 0.0–149.0)
VLDL: 21.6 mg/dL (ref 0.0–40.0)

## 2023-06-13 LAB — HEMOGLOBIN A1C: Hgb A1c MFr Bld: 8 % — ABNORMAL HIGH (ref 4.6–6.5)

## 2023-06-13 NOTE — Progress Notes (Signed)
 No critical labs need to be addressed urgently. We will discuss labs in detail at upcoming office visit.

## 2023-06-20 ENCOUNTER — Ambulatory Visit: Payer: Managed Care, Other (non HMO) | Admitting: Family Medicine

## 2023-06-20 ENCOUNTER — Encounter: Payer: Self-pay | Admitting: Family Medicine

## 2023-06-20 VITALS — BP 118/62 | HR 83 | Temp 97.5°F | Ht 73.5 in | Wt 302.5 lb

## 2023-06-20 DIAGNOSIS — E1169 Type 2 diabetes mellitus with other specified complication: Secondary | ICD-10-CM

## 2023-06-20 DIAGNOSIS — E1159 Type 2 diabetes mellitus with other circulatory complications: Secondary | ICD-10-CM

## 2023-06-20 DIAGNOSIS — I152 Hypertension secondary to endocrine disorders: Secondary | ICD-10-CM

## 2023-06-20 DIAGNOSIS — R053 Chronic cough: Secondary | ICD-10-CM

## 2023-06-20 DIAGNOSIS — E785 Hyperlipidemia, unspecified: Secondary | ICD-10-CM

## 2023-06-20 MED ORDER — BENZONATATE 200 MG PO CAPS: 200.0000 mg | ORAL_CAPSULE | Freq: Two times a day (BID) | ORAL | 0 refills | Status: DC | PRN

## 2023-06-20 NOTE — Assessment & Plan Note (Signed)
 Chronic, inadequate control but significant improvement in the last 3 months with increase in glimepiride to 8 mg daily. Ozempic 1 mg every 14 days (we are hesitant to increase this given possibly connected to his small bowel obstruction in the past) We did discuss possible change to [facility] but this could potentially have similar side effects.  We discussed him possibly contacting his GI MD for their consideration on this topic. He is on max Synjardy XR. Encouraged him to follow his blood sugar with either the continuous blood sugar monitor or fingersticks so that he can pay more attention to his eating habits and activity. He is hesitant as it was bothersome to him. Offered referral to endocrinology he will consider this.  Reevaluate in 3 months.

## 2023-06-20 NOTE — Assessment & Plan Note (Signed)
 Chronic, inadequate control.  He has not yet retried Crestor as he was concerned it was causing his shoulder pain. I encouraged him to try it for at least a 2-week course at 3 days a week to see if he can tolerate it.  Titrate up as able. I asked him to call if he is not tolerating it for [place] to try pravastatin or Zetia.  Recheck in 3 months.

## 2023-06-20 NOTE — Progress Notes (Signed)
 Patient ID: Jerry Hart, male    DOB: 02-04-1971, 53 y.o.   MRN: [id number]  This visit was conducted in person.  BP 118/62 (BP Location: Right Arm, Patient Position: Sitting, Cuff Size: Large)   Pulse 83   Temp (!) 97.5 F (36.4 C) (Temporal)   Ht 6' 1.5" (1.867 m)   Wt (!) 302 lb 8 oz (137.2 kg)   SpO2 98%   BMI 39.37 kg/m    CC:  Chief Complaint  Patient presents with   Diabetes    Subjective:   HPI: Jerry Hart is a 53 y.o. male presenting on 06/20/2023 for Diabetes    Diabetes: He is on Ozempic 1 mg every 14 days ( not on weekly given ? SBO issues), on Synjardy XR 25/2000 mg daily,   At last OV increased glimepiride to 8 mg daily There has been some improvement in A1c down from 9 to 8. He has lost weight with ozempic.. 30 lbs in last year. .. Has  lost 6 lbs in last 3 months.  Lab Results  Component Value Date   HGBA1C 8.0 (H) 06/13/2023  Using medications without difficulties: Hypoglycemic episodes: Hyperglycemic episodes: Feet problems: none Blood Sugars averaging: not checking eye exam within last year: due  Wt Readings from Last 3 Encounters:  06/20/23 (!) 302 lb 8 oz (137.2 kg)  03/21/23 (!) 308 lb 4 oz (139.8 kg)  12/20/22 (!) 305 lb 6 oz (138.5 kg)     Elevated Cholesterol:   Not taking  LDL not at goal.   Lab Results  Component Value Date   CHOL 167 06/13/2023   HDL 33.50 (L) 06/13/2023   LDLCALC 112 (H) 06/13/2023   TRIG 108.0 06/13/2023   CHOLHDL 5 06/13/2023  Using medications without problems: Muscle aches:  Diet compliance: He has been trying to eat more grilled item. Exercise: walking daily, at work. Other complaints:  Hypertension:  Well-controlled on amlodipine 5 mg p.o. daily, benazepril 40 mg daily BP Readings from Last 3 Encounters:  06/20/23 118/62  03/21/23 120/74  12/20/22 118/68  Using medication without problems or lightheadedness:  none Chest pain with exertion: none Edema: none Short of breath:  none Average home BPs: Other issues:    Relevant past medical, surgical, family and social history reviewed and updated as indicated. Interim medical history since our last visit reviewed. Allergies and medications reviewed and updated. Outpatient Medications Prior to Visit  Medication Sig Dispense Refill   amLODipine (NORVASC) 5 MG tablet Take 5 mg by mouth in the morning.     benazepril (LOTENSIN) 40 MG tablet Take 40 mg by mouth daily.     Continuous Glucose Receiver (DEXCOM G7 RECEIVER) DEVI Check blood sugar as needed 1 each 11   Continuous Glucose Sensor (DEXCOM G7 SENSOR) MISC Check blood sugar as needed 1 each 11   glimepiride (AMARYL) 4 MG tablet Take 2 tablets (8 mg total) by mouth daily with breakfast. 180 tablet 1   OZEMPIC, 1 MG/DOSE, 4 MG/3ML SOPN Inject 1 mg into the skin every 14 (fourteen) days. 9 mL 3   rosuvastatin (CRESTOR) 20 MG tablet Take 20 mg by mouth at bedtime.     SYNJARDY XR 25-1000 MG TB24 Take 1 tablet by mouth daily. 90 tablet 0   tadalafil (CIALIS) 5 MG tablet Take 5 mg by mouth daily.     tiZANidine (ZANAFLEX) 2 MG tablet Take 2 mg by mouth at bedtime as needed.     No  facility-administered medications prior to visit.     Per HPI unless specifically indicated in ROS section below Review of Systems  Constitutional:  Negative for fatigue and fever.  HENT:  Negative for ear pain.   Eyes:  Negative for pain.  Respiratory:  Negative for cough and shortness of breath.   Cardiovascular:  Negative for chest pain, palpitations and leg swelling.  Gastrointestinal:  Negative for abdominal pain.  Genitourinary:  Negative for dysuria.  Musculoskeletal:  Negative for arthralgias.  Neurological:  Negative for syncope, light-headedness and headaches.  Psychiatric/Behavioral:  Negative for dysphoric mood.    Objective:  BP 118/62 (BP Location: Right Arm, Patient Position: Sitting, Cuff Size: Large)   Pulse 83   Temp (!) 97.5 F (36.4 C) (Temporal)   Ht 6'  1.5" (1.867 m)   Wt (!) 302 lb 8 oz (137.2 kg)   SpO2 98%   BMI 39.37 kg/m   Wt Readings from Last 3 Encounters:  06/20/23 (!) 302 lb 8 oz (137.2 kg)  03/21/23 (!) 308 lb 4 oz (139.8 kg)  12/20/22 (!) 305 lb 6 oz (138.5 kg)      Physical Exam Constitutional:      Appearance: He is well-developed. He is obese.  HENT:     Head: Normocephalic.     Right Ear: Hearing normal.     Left Ear: Hearing normal.     Nose: Nose normal.  Neck:     Thyroid: No thyroid mass or thyromegaly.     Vascular: No carotid bruit.     Trachea: Trachea normal.  Cardiovascular:     Rate and Rhythm: Normal rate and regular rhythm.     Pulses: Normal pulses.     Heart sounds: Heart sounds not distant. No murmur heard.    No friction rub. No gallop.     Comments: No peripheral edema Pulmonary:     Effort: Pulmonary effort is normal. No respiratory distress.     Breath sounds: Normal breath sounds.  Skin:    General: Skin is warm and dry.     Findings: No rash.  Psychiatric:        Speech: Speech normal.        Behavior: Behavior normal.        Thought Content: Thought content normal.       Results for orders placed or performed in visit on 06/13/23  Lipid panel   Collection Time: 06/13/23  8:08 AM  Result Value Ref Range   Cholesterol 167 0 - 200 mg/dL   Triglycerides 454.0 0.0 - 149.0 mg/dL   HDL 98.11 (L) >91.47 mg/dL   VLDL 82.9 0.0 - 56.2 mg/dL   LDL Cholesterol 130 (H) 0 - 99 mg/dL   Total CHOL/HDL Ratio 5    NonHDL 133.71   Hemoglobin A1c   Collection Time: 06/13/23  8:08 AM  Result Value Ref Range   Hgb A1c MFr Bld 8.0 (H) 4.6 - 6.5 %  Comprehensive metabolic panel   Collection Time: 06/13/23  8:08 AM  Result Value Ref Range   Sodium 139 135 - 145 mEq/L   Potassium 3.9 3.5 - 5.1 mEq/L   Chloride 105 96 - 112 mEq/L   CO2 27 19 - 32 mEq/L   Glucose, Bld 97 70 - 99 mg/dL   BUN 16 6 - 23 mg/dL   Creatinine, Ser 8.65 0.40 - 1.50 mg/dL   Total Bilirubin 0.4 0.2 - 1.2 mg/dL    Alkaline Phosphatase 87 39 - 117  U/L   AST 21 0 - 37 U/L   ALT 28 0 - 53 U/L   Total Protein 7.5 6.0 - 8.3 g/dL   Albumin 4.2 3.5 - 5.2 g/dL   GFR 65.78 >46.96 mL/min   Calcium 9.5 8.4 - 10.5 mg/dL    Assessment and Plan  Type 2 diabetes mellitus with other circulatory complication, without long-term current use of insulin (HCC) Assessment & Plan:  Chronic, inadequate control but significant improvement in the last 3 months with increase in glimepiride to 8 mg daily. Ozempic 1 mg every 14 days (we are hesitant to increase this given possibly connected to his small bowel obstruction in the past) We did discuss possible change to [facility] but this could potentially have similar side effects.  We discussed him possibly contacting his GI MD for their consideration on this topic. He is on max Synjardy XR. Encouraged him to follow his blood sugar with either the continuous blood sugar monitor or fingersticks so that he can pay more attention to his eating habits and activity. He is hesitant as it was bothersome to him. Offered referral to endocrinology he will consider this.  Reevaluate in 3 months.   Hypertension associated with diabetes (HCC) Assessment & Plan: Stable, chronic.  Continue current medication.   Well-controlled on amlodipine 5 mg p.o. daily, benazepril 40 mg daily   Hyperlipidemia associated with type 2 diabetes mellitus (HCC) Assessment & Plan: Chronic, inadequate control.  He has not yet retried Crestor as he was concerned it was causing his shoulder pain. I encouraged him to try it for at least a 2-week course at 3 days a week to see if he can tolerate it.  Titrate up as able. I asked him to call if he is not tolerating it for [place] to try pravastatin or Zetia.  Recheck in 3 months.   Persistent cough  Other orders -     Benzonatate; Take 1 capsule (200 mg total) by mouth 2 (two) times daily as needed for cough.  Dispense: 20 capsule; Refill: 0      Return  in about 3 months (around 09/20/2023) for diabetes follow up with  labs prior .   Kerby Nora, MD

## 2023-06-20 NOTE — Assessment & Plan Note (Signed)
Stable, chronic.  Continue current medication.   Well-controlled on amlodipine 5 mg p.o. daily, benazepril 40 mg daily

## 2023-06-20 NOTE — Patient Instructions (Signed)
 Can start flonase 2 spray per nostril daily. Can use benzonatate for cough  as needed.  Work on low [employer], increase activity.  Check blood sugar in AMs fasting.. shoulde be < 120.  Can check 2 hours after meals .Marland Kitchen Sugar should be < 180.

## 2023-08-07 ENCOUNTER — Other Ambulatory Visit: Payer: Self-pay | Admitting: Family Medicine

## 2023-10-17 ENCOUNTER — Other Ambulatory Visit

## 2023-10-21 ENCOUNTER — Ambulatory Visit: Admitting: Family Medicine

## 2023-10-22 ENCOUNTER — Telehealth: Payer: Self-pay | Admitting: *Deleted

## 2023-10-22 DIAGNOSIS — E1169 Type 2 diabetes mellitus with other specified complication: Secondary | ICD-10-CM

## 2023-10-22 DIAGNOSIS — E1159 Type 2 diabetes mellitus with other circulatory complications: Secondary | ICD-10-CM

## 2023-10-22 NOTE — Telephone Encounter (Signed)
-----   Message from Rocky FORBES Chock sent at 10/21/2023 10:54 AM EDT ----- Regarding: Labs for Friday 8.8.25 Please put lab orders in future. Thank you, Rocky

## 2023-11-07 ENCOUNTER — Ambulatory Visit: Payer: Self-pay | Admitting: Family Medicine

## 2023-11-07 ENCOUNTER — Other Ambulatory Visit

## 2023-11-07 DIAGNOSIS — E1169 Type 2 diabetes mellitus with other specified complication: Secondary | ICD-10-CM

## 2023-11-07 DIAGNOSIS — E1159 Type 2 diabetes mellitus with other circulatory complications: Secondary | ICD-10-CM

## 2023-11-07 DIAGNOSIS — E785 Hyperlipidemia, unspecified: Secondary | ICD-10-CM

## 2023-11-07 LAB — HEMOGLOBIN A1C: Hgb A1c MFr Bld: 8.3 % — ABNORMAL HIGH (ref 4.6–6.5)

## 2023-11-07 LAB — COMPREHENSIVE METABOLIC PANEL WITH GFR
ALT: 26 U/L (ref 0–53)
AST: 21 U/L (ref 0–37)
Albumin: 4.2 g/dL (ref 3.5–5.2)
Alkaline Phosphatase: 93 U/L (ref 39–117)
BUN: 16 mg/dL (ref 6–23)
CO2: 27 meq/L (ref 19–32)
Calcium: 9.5 mg/dL (ref 8.4–10.5)
Chloride: 100 meq/L (ref 96–112)
Creatinine, Ser: 0.87 mg/dL (ref 0.40–1.50)
GFR: 100.01 mL/min (ref >60.00)
Glucose, Bld: 91 mg/dL (ref 70–99)
Potassium: 3.7 meq/L (ref 3.5–5.1)
Sodium: 138 meq/L (ref 135–145)
Total Bilirubin: 0.3 mg/dL (ref 0.2–1.2)
Total Protein: 7.2 g/dL (ref 6.0–8.3)

## 2023-11-07 LAB — LIPID PANEL
Cholesterol: 142 mg/dL (ref 0–200)
HDL: 31.9 mg/dL — ABNORMAL LOW (ref >39.00)
LDL Cholesterol: 87 mg/dL (ref 0–99)
NonHDL: 110.13
Total CHOL/HDL Ratio: 4
Triglycerides: 114 mg/dL (ref 0.0–149.0)
VLDL: 22.8 mg/dL (ref 0.0–40.0)

## 2023-11-07 LAB — MICROALBUMIN / CREATININE URINE RATIO
Creatinine,U: 100 mg/dL
Microalb Creat Ratio: 220.3 mg/g — ABNORMAL HIGH (ref 0.0–30.0)
Microalb, Ur: 22 mg/dL — ABNORMAL HIGH (ref 0.0–1.9)

## 2023-11-07 NOTE — Progress Notes (Signed)
 No critical labs need to be addressed urgently. We will discuss labs in detail at upcoming office visit.

## 2023-11-14 ENCOUNTER — Ambulatory Visit: Admitting: Family Medicine

## 2023-11-14 ENCOUNTER — Encounter: Payer: Self-pay | Admitting: Family Medicine

## 2023-11-14 VITALS — BP 112/72 | HR 72 | Temp 98.1°F | Ht 73.5 in | Wt 301.8 lb

## 2023-11-14 DIAGNOSIS — E1159 Type 2 diabetes mellitus with other circulatory complications: Secondary | ICD-10-CM | POA: Diagnosis not present

## 2023-11-14 DIAGNOSIS — I152 Hypertension secondary to endocrine disorders: Secondary | ICD-10-CM

## 2023-11-14 DIAGNOSIS — R809 Proteinuria, unspecified: Secondary | ICD-10-CM

## 2023-11-14 DIAGNOSIS — E785 Hyperlipidemia, unspecified: Secondary | ICD-10-CM | POA: Diagnosis not present

## 2023-11-14 DIAGNOSIS — Z7984 Long term (current) use of oral hypoglycemic drugs: Secondary | ICD-10-CM

## 2023-11-14 DIAGNOSIS — E1169 Type 2 diabetes mellitus with other specified complication: Secondary | ICD-10-CM | POA: Diagnosis not present

## 2023-11-14 DIAGNOSIS — Z7985 Long-term (current) use of injectable non-insulin antidiabetic drugs: Secondary | ICD-10-CM

## 2023-11-14 MED ORDER — AMLODIPINE BESY-BENAZEPRIL HCL 5-40 MG PO CAPS: 1.0000 | ORAL_CAPSULE | Freq: Every day | ORAL | 3 refills | Status: AC

## 2023-11-14 MED ORDER — TIRZEPATIDE 5 MG/0.5ML ~~LOC~~ SOAJ: 5.0000 mg | SUBCUTANEOUS | 3 refills | Status: DC

## 2023-11-14 MED ORDER — ROSUVASTATIN CALCIUM 20 MG PO TABS: 20.0000 mg | ORAL_TABLET | ORAL | Status: AC

## 2023-11-14 MED ORDER — TIRZEPATIDE 5 MG/0.5ML ~~LOC~~ SOAJ
5.0000 mg | SUBCUTANEOUS | 3 refills | Status: DC
Start: 2023-11-14 — End: 2023-11-14

## 2023-11-14 NOTE — Assessment & Plan Note (Addendum)
 Chronic, inadequate control  On glimepiride  to 8 mg daily. We will change Ozempic  to Mounjaro  5 mg weekly but will likely need to increase He is on max Synjardy  XR... Which contains an SGLT2 inhibitor Encouraged him to follow his blood sugar with either the continuous blood sugar monitor or fingersticks so that he can pay more attention to his eating habits and activity.  Offered referral to endocrinology he will consider this.  Reevaluate in 3 months.

## 2023-11-14 NOTE — Assessment & Plan Note (Signed)
 New diagnosis Patient already on ACE inhibitor, SGLT2 inhibitor and GLP-1 medication.  Needs better diabetes control.

## 2023-11-14 NOTE — Progress Notes (Signed)
 Patient ID: Jerry Hart, male    DOB: November 19, 1970, 53 y.o.   MRN: [id number]  This visit was conducted in person.  BP 112/72 (BP Location: Right Arm, Patient Position: Sitting, Cuff Size: Large)   Pulse 72   Temp 98.1 F (36.7 C) (Oral)   Ht 6' 1.5 (1.867 m)   Wt (!) 301 lb 12.8 oz (136.9 kg)   SpO2 98%   BMI 39.28 kg/m    CC:  Chief Complaint  Patient presents with   Medical Management of Chronic Issues    Subjective:   HPI: Jerry Hart is a 53 y.o. male presenting on 11/14/2023 for Medical Management of Chronic Issues  Diabetes: He is on Ozempic  1 mg every 14 days ( not on weekly given ? SBO issues), on Synjardy  XR 25/2000 mg daily,   At last OV increased glimepiride  to 8 mg daily  Has not taken ozempic   in a while There has been some improvement in A1c down from 9 to 8. He has lost weight with ozempic .. 30 lbs in last year. .. Has  lost 6 lbs in last 3 months.  Lab Results  Component Value Date   HGBA1C 8.3 (H) 11/07/2023  Using medications without difficulties: Hypoglycemic episodes: Hyperglycemic episodes: Feet problems: none Blood Sugars averaging: not checking eye exam within last year: due  Wt Readings from Last 3 Encounters:  11/14/23 (!) 301 lb 12.8 oz (136.9 kg)  06/20/23 (!) 302 lb 8 oz (137.2 kg)  03/21/23 (!) 308 lb 4 oz (139.8 kg)     Elevated Cholesterol:   Crest LDL not at goal.   Lab Results  Component Value Date   CHOL 142 11/07/2023   HDL 31.90 (L) 11/07/2023   LDLCALC 87 11/07/2023   TRIG 114.0 11/07/2023   CHOLHDL 4 11/07/2023  Using medications without problems: Muscle aches:  Diet compliance: He has been trying to eat more grilled item. Exercise: walking daily, at work. Other complaints:  Hypertension:  Well-controlled on amlodipine  5 mg p.o. daily, benazepril  40 mg daily BP Readings from Last 3 Encounters:  11/14/23 112/72  06/20/23 118/62  03/21/23 120/74  Using medication without problems or lightheadedness:   none Chest pain with exertion: none Edema: none Short of breath: none Average home BPs: Other issues:    Relevant past medical, surgical, family and social history reviewed and updated as indicated. Interim medical history since our last visit reviewed. Allergies and medications reviewed and updated. Outpatient Medications Prior to Visit  Medication Sig Dispense Refill   benzonatate  (TESSALON ) 200 MG capsule Take 1 capsule (200 mg total) by mouth 2 (two) times daily as needed for cough. 20 capsule 0   Continuous Glucose Receiver (DEXCOM G7 RECEIVER) DEVI Check blood sugar as needed 1 each 11   Continuous Glucose Sensor (DEXCOM G7 SENSOR) MISC Check blood sugar as needed 1 each 11   glimepiride  (AMARYL ) 4 MG tablet Take 2 tablets (8 mg total) by mouth daily with breakfast. 180 tablet 1   SYNJARDY  XR 25-1000 MG TB24 TAKE 1 TABLET BY MOUTH EVERY DAY 90 tablet 1   tadalafil (CIALIS) 5 MG tablet Take 5 mg by mouth daily.     tiZANidine (ZANAFLEX) 2 MG tablet Take 2 mg by mouth at bedtime as needed.     amLODipine  (NORVASC ) 5 MG tablet Take 5 mg by mouth in the morning.     benazepril  (LOTENSIN ) 40 MG tablet Take 40 mg by mouth daily.  OZEMPIC , 1 MG/DOSE, 4 MG/3ML SOPN Inject 1 mg into the skin every 14 (fourteen) days. 9 mL 3   rosuvastatin  (CRESTOR ) 20 MG tablet Take 20 mg by mouth at bedtime.     No facility-administered medications prior to visit.     Per HPI unless specifically indicated in ROS section below Review of Systems  Constitutional:  Negative for fatigue and fever.  HENT:  Negative for ear pain.   Eyes:  Negative for pain.  Respiratory:  Negative for cough and shortness of breath.   Cardiovascular:  Negative for chest pain, palpitations and leg swelling.  Gastrointestinal:  Negative for abdominal pain.  Genitourinary:  Negative for dysuria.  Musculoskeletal:  Negative for arthralgias.  Neurological:  Negative for syncope, light-headedness and headaches.   Psychiatric/Behavioral:  Negative for dysphoric mood.    Objective:  BP 112/72 (BP Location: Right Arm, Patient Position: Sitting, Cuff Size: Large)   Pulse 72   Temp 98.1 F (36.7 C) (Oral)   Ht 6' 1.5 (1.867 m)   Wt (!) 301 lb 12.8 oz (136.9 kg)   SpO2 98%   BMI 39.28 kg/m   Wt Readings from Last 3 Encounters:  11/14/23 (!) 301 lb 12.8 oz (136.9 kg)  06/20/23 (!) 302 lb 8 oz (137.2 kg)  03/21/23 (!) 308 lb 4 oz (139.8 kg)      Physical Exam Constitutional:      Appearance: He is well-developed. He is obese.  HENT:     Head: Normocephalic.     Right Ear: Hearing normal.     Left Ear: Hearing normal.     Nose: Nose normal.  Neck:     Thyroid: No thyroid mass or thyromegaly.     Vascular: No carotid bruit.     Trachea: Trachea normal.  Cardiovascular:     Rate and Rhythm: Normal rate and regular rhythm.     Pulses: Normal pulses.     Heart sounds: Heart sounds not distant. No murmur heard.    No friction rub. No gallop.     Comments: No peripheral edema Pulmonary:     Effort: Pulmonary effort is normal. No respiratory distress.     Breath sounds: Normal breath sounds.  Skin:    General: Skin is warm and dry.     Findings: No rash.  Psychiatric:        Speech: Speech normal.        Behavior: Behavior normal.        Thought Content: Thought content normal.       Results for orders placed or performed in visit on 11/07/23  Microalbumin / creatinine urine ratio   Collection Time: 11/07/23  9:30 AM  Result Value Ref Range   Microalb, Ur 22.0 (H) 0.0 - 1.9 mg/dL   Creatinine,U 899.9 mg/dL   Microalb Creat Ratio 220.3 (H) 0.0 - 30.0 mg/g  Lipid panel   Collection Time: 11/07/23  9:30 AM  Result Value Ref Range   Cholesterol 142 0 - 200 mg/dL   Triglycerides 885.9 0.0 - 149.0 mg/dL   HDL 68.09 (L) >60.99 mg/dL   VLDL 77.1 0.0 - 59.9 mg/dL   LDL Cholesterol 87 0 - 99 mg/dL   Total CHOL/HDL Ratio 4    NonHDL 110.13   Hemoglobin A1c   Collection Time:  11/07/23  9:30 AM  Result Value Ref Range   Hgb A1c MFr Bld 8.3 (H) 4.6 - 6.5 %  Comprehensive metabolic panel   Collection Time: 11/07/23  9:30  AM  Result Value Ref Range   Sodium 138 135 - 145 mEq/L   Potassium 3.7 3.5 - 5.1 mEq/L   Chloride 100 96 - 112 mEq/L   CO2 27 19 - 32 mEq/L   Glucose, Bld 91 70 - 99 mg/dL   BUN 16 6 - 23 mg/dL   Creatinine, Ser 9.12 0.40 - 1.50 mg/dL   Total Bilirubin 0.3 0.2 - 1.2 mg/dL   Alkaline Phosphatase 93 39 - 117 U/L   AST 21 0 - 37 U/L   ALT 26 0 - 53 U/L   Total Protein 7.2 6.0 - 8.3 g/dL   Albumin  4.2 3.5 - 5.2 g/dL   GFR 899.98 >39.99 mL/min   Calcium  9.5 8.4 - 10.5 mg/dL    Assessment and Plan  Hyperlipidemia associated with type 2 diabetes mellitus (HCC) Assessment & Plan: Chronic, improved control   Crestor  20 mg every other day  Recheck in 3 months.   Type 2 diabetes mellitus with other circulatory complication, without long-term current use of insulin  (HCC) Assessment & Plan:  Chronic, inadequate control  On glimepiride  to 8 mg daily. We will change Ozempic  to Mounjaro  5 mg weekly but will likely need to increase He is on max Synjardy  XR... Which contains an SGLT2 inhibitor Encouraged him to follow his blood sugar with either the continuous blood sugar monitor or fingersticks so that he can pay more attention to his eating habits and activity.  Offered referral to endocrinology he will consider this.  Reevaluate in 3 months.   Hypertension associated with diabetes (HCC) Assessment & Plan: Stable, chronic.  Continue current medication.   Well-controlled on amlodipine  5 mg p.o. daily, benazepril  40 mg daily   Microalbuminuria Assessment & Plan: New diagnosis Patient already on ACE inhibitor, SGLT2 inhibitor and GLP-1 medication.  Needs better diabetes control.   Other orders -     Rosuvastatin  Calcium ; Take 1 tablet (20 mg total) by mouth every other day. -     amLODIPine  Besy-Benazepril  HCl; Take 1 capsule by  mouth daily.  Dispense: 90 capsule; Refill: 3 -     Tirzepatide ; Inject 5 mg into the skin once a week.  Dispense: 6 mL; Refill: 3       Return in about 3 months (around 02/14/2024) for diabetes follow up POC A1C.   Greig Ring, MD

## 2023-11-14 NOTE — Assessment & Plan Note (Signed)
Stable, chronic.  Continue current medication.   Well-controlled on amlodipine 5 mg p.o. daily, benazepril 40 mg daily

## 2023-11-14 NOTE — Assessment & Plan Note (Addendum)
 Chronic, improved control   Crestor  20 mg every other day  Recheck in 3 months.

## 2023-11-14 NOTE — Patient Instructions (Addendum)
 Can use Co Q10  daily for any muscle ache possibly from crestor .  Continue crestor  20 mg every other day.  Change OZempic  to Mpounjaro.

## 2023-11-17 ENCOUNTER — Other Ambulatory Visit: Payer: Self-pay | Admitting: Family Medicine

## 2023-11-19 ENCOUNTER — Encounter (HOSPITAL_BASED_OUTPATIENT_CLINIC_OR_DEPARTMENT_OTHER): Payer: Self-pay

## 2023-11-19 ENCOUNTER — Emergency Department (HOSPITAL_BASED_OUTPATIENT_CLINIC_OR_DEPARTMENT_OTHER)

## 2023-11-19 ENCOUNTER — Other Ambulatory Visit: Payer: Self-pay

## 2023-11-19 ENCOUNTER — Emergency Department (HOSPITAL_BASED_OUTPATIENT_CLINIC_OR_DEPARTMENT_OTHER)
Admission: EM | Admit: 2023-11-19 | Discharge: 2023-11-19 | Disposition: A | Discharge status: Home / Self Care | Attending: Emergency Medicine | Admitting: Emergency Medicine

## 2023-11-19 DIAGNOSIS — S90932A Unspecified superficial injury of left great toe, initial encounter: Secondary | ICD-10-CM | POA: Diagnosis present

## 2023-11-19 DIAGNOSIS — Y990 Civilian activity done for income or pay: Secondary | ICD-10-CM | POA: Insufficient documentation

## 2023-11-19 DIAGNOSIS — S97102A Crushing injury of unspecified left toe(s), initial encounter: Secondary | ICD-10-CM | POA: Insufficient documentation

## 2023-11-19 DIAGNOSIS — W208XXA Other cause of strike by thrown, projected or falling object, initial encounter: Secondary | ICD-10-CM | POA: Diagnosis not present

## 2023-11-19 MED ORDER — HYDROCODONE-ACETAMINOPHEN 5-325 MG PO TABS
2.0000 | ORAL_TABLET | Freq: Once | ORAL | Status: AC
  Administered 2023-11-19: 2 via ORAL
  Filled 2023-11-19: qty 2

## 2023-11-19 MED ORDER — AMOXICILLIN-POT CLAVULANATE 500-125 MG PO TABS: 1.0000 | ORAL_TABLET | Freq: Three times a day (TID) | ORAL | 0 refills | Status: DC

## 2023-11-19 MED ORDER — AMOXICILLIN-POT CLAVULANATE 875-125 MG PO TABS
1.0000 | ORAL_TABLET | Freq: Once | ORAL | Status: AC
  Administered 2023-11-19: 1 via ORAL
  Filled 2023-11-19: qty 1

## 2023-11-19 MED ORDER — HYDROCODONE-ACETAMINOPHEN 5-325 MG PO TABS: 1.0000 | ORAL_TABLET | Freq: Four times a day (QID) | ORAL | 0 refills | Status: AC | PRN

## 2023-11-19 NOTE — Discharge Instructions (Signed)
 Begin taking Augmentin  as prescribed.  Begin taking hydrocodone  as prescribed as needed for pain.  Local wound care with bacitracin and dressing changes twice daily.  Return to the ER for any new and/or concerning issues.

## 2023-11-19 NOTE — ED Provider Notes (Signed)
 [place] EMERGENCY DEPARTMENT AT [facility] Provider Note   CSN: [id number] Arrival date & time: 11/19/23  0023     Patient presents with: Foot Pain   Jerry Hart is a 53 y.o. male.   Patient is a 53 year old male presenting with a left great toe injury.  He was at work and a tray table fell over and landed on his foot.  He has bleeding from the nail.       Prior to Admission medications   Medication Sig Start Date End Date Taking? Authorizing Provider  amLODipine -benazepril  (LOTREL) 5-40 MG capsule Take 1 capsule by mouth daily. 11/14/23   Bedsole, Amy E, MD  benzonatate  (TESSALON ) 200 MG capsule Take 1 capsule (200 mg total) by mouth 2 (two) times daily as needed for cough. 06/20/23   Avelina Greig BRAVO, MD  Continuous Glucose Receiver (DEXCOM G7 RECEIVER) DEVI Check blood sugar as needed 03/21/23   Bedsole, Amy E, MD  Continuous Glucose Sensor (DEXCOM G7 SENSOR) MISC Check blood sugar as needed 03/21/23   Bedsole, Amy E, MD  glimepiride  (AMARYL ) 4 MG tablet TAKE 2 TABLETS BY MOUTH DAILY WITH BREAKFAST. 11/17/23   Bedsole, Amy E, MD  rosuvastatin  (CRESTOR ) 20 MG tablet Take 1 tablet (20 mg total) by mouth every other day. 11/14/23   Bedsole, Amy E, MD  SYNJARDY  XR 25-1000 MG TB24 TAKE 1 TABLET BY MOUTH EVERY DAY 08/07/23   Bedsole, Amy E, MD  tadalafil (CIALIS) 5 MG tablet Take 5 mg by mouth daily.    [provider]  tirzepatide  (MOUNJARO ) 5 MG/0.5ML Pen Inject 5 mg into the skin once a week. 11/14/23   Bedsole, Amy E, MD  tiZANidine (ZANAFLEX) 2 MG tablet Take 2 mg by mouth at bedtime as needed. 09/03/22   [provider]    Allergies: Patient has no known allergies.    Review of Systems  All other systems reviewed and are negative.   Updated Vital Signs BP 125/76 (BP Location: Right Arm)   Pulse 72   Temp 98.4 F (36.9 C) (Oral)   Resp 18   SpO2 99%   Physical Exam Vitals and nursing note reviewed.  Constitutional:      Appearance: Normal  appearance.  Pulmonary:     Effort: Pulmonary effort is normal.  Musculoskeletal:     Comments: The base of the nail has been avulsed from the cuticle.  Bleeding is controlled.  Skin:    General: Skin is warm and dry.  Neurological:     Mental Status: He is alert.     (all labs ordered are listed, but only abnormal results are displayed) Labs Reviewed - No data to display  EKG: None  Radiology: DG Foot Complete Left Result Date: 11/19/2023 CLINICAL DATA:  Left foot pain.  Dropped heavy object on foot. EXAM: LEFT FOOT - COMPLETE 3+ VIEW COMPARISON:  None Available. FINDINGS: No acute fracture or dislocation.  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 IMPRESSION: No acute fracture or dislocation. Electronically Signed   By: Norman Gatlin M.D.   On: 11/19/2023 01:00     Procedures   Medications Ordered in the ED - No data to display                                  Medical Decision Making Amount and/or Complexity of Data Reviewed Radiology: ordered.   X-rays are negative for fracture.  The patient  does have an avulsion of the nail from the base of the nailbed, but the remainder of the nail appears firmly embedded.  I was able to relocate the nail somewhat, but suspect the nail will eventually fall off.  As the patient is diabetic, I will treat with Augmentin  and have him perform warm soaks at home.     Final diagnoses:  None    ED Discharge Orders     None          Geroldine Berg, MD 11/19/23 [phone]

## 2023-11-19 NOTE — ED Triage Notes (Signed)
 Pt reports left foot  pain. Pt reports he was work and dropped a heavy object on his left foot. Pt denies any ankle pain.

## 2023-11-19 NOTE — ED Notes (Signed)
 Pt given discharge instructions. Opportunities given for questions. Pt stable at time of discharge.

## 2023-12-12 ENCOUNTER — Ambulatory Visit (INDEPENDENT_AMBULATORY_CARE_PROVIDER_SITE_OTHER): Admitting: Podiatry

## 2023-12-12 ENCOUNTER — Encounter: Payer: Self-pay | Admitting: Podiatry

## 2023-12-12 DIAGNOSIS — M79671 Pain in right foot: Secondary | ICD-10-CM

## 2023-12-12 DIAGNOSIS — M79672 Pain in left foot: Secondary | ICD-10-CM | POA: Diagnosis not present

## 2023-12-12 DIAGNOSIS — L600 Ingrowing nail: Secondary | ICD-10-CM

## 2023-12-12 MED ORDER — CEPHALEXIN 500 MG PO CAPS: 1000.0000 mg | ORAL_CAPSULE | Freq: Two times a day (BID) | ORAL | 0 refills | Status: AC

## 2023-12-12 NOTE — Patient Instructions (Signed)

## 2023-12-12 NOTE — Progress Notes (Signed)
 Patient complains of painful ingrown nail of 1 and 2 left.  Dropped a piece of steel on the foot 2 weeks ago and nails been loose and developed bleeding into the nails.  He is a type II diabetic his last A1c was 8.3.. Patient denies fevers, chills, nausea, vomiting.  He had x-rays on August 20 of this year.  No fractures were noted.  Objective:  Vitals: Reviewed  General: Well developed, nourished, in no acute distress, alert and oriented x3   Vascular: DP pulse 2/4 bilateral. PT pulse 1/4 bilateral.  Moderate edema lower leg bilaterally capillary refill time immediate  Dermatology: Erythema, edema, incurvated, gryphotic nail hallux left and second toe left with serous drainage . Tenderness present with palpation. Normal skin tone and texture feet with normal hair growth.  Nails are thickened dystrophic 1 through 5 bilaterally with subungual debris discoloration and thickening of the nail.  Gets intermittent pain in the nails 1 through 5 bilaterally  Neurological: Grossly intact. Normal reflexes.   Musculoskeletal: Tenderness with palpation of the distal 1st and 2nd toe left. No tenderness or painful ROM at IPJ.  Reviewed radiographs from November 19, 2023.  No signs of any fracture in hallux or second toe.  No evidence of osteomyelitis.  Diagnosis: 1.  Pain feet bilaterally 2.  Ingrown nail hallux and second toe left  Plan: -New office visit for evaluation and management level 3.  Modifier 25. - Discussed with him the ingrown nails from injury 1st and 2nd toe left.  Recommended avulsion of these.  Discussed the onychomycosis and the rest the nails we may consider treating this with Lamisil prevent future problems with the nails. -discussed etiology and treatment of ingrown nails. Discussed surgical vs conservative treatment. -Consent signed for appropriate matrixectomy affected nail(s). -Rx: Keflex  500 mg, 2 p.o. twice daily for 7 days  Procedure(s):   - Avulsion hallux nail left: Toe  anesthetized with 3cc 2:1 mixture 2% Lidocaine  with epinephrine .  Prepped toes with Betadine.  Avulsed nails and debrided any loose tissue.  No splints or lacerations in the nailbed were noted.  Applied triple antibiotic to nailbed and applied gauze and Coban dressing.  - Avulsion second nail left: Toe anesthetized with 3cc 2:1 mixture 2% Lidocaine  with epinephrine .  Prepped toes with Betadine.  Avulsed nails and debrided any loose tissue.  No splints or lacerations in the nailbed were noted.  Applied triple antibiotic to nailbed and applied gauze and Coban dressing. - Written and oral postoperative instructions given.  -Return for post-op 2 weeks.  JINNY Prentice Binder, DPM

## 2023-12-21 ENCOUNTER — Other Ambulatory Visit: Payer: Self-pay | Admitting: Family Medicine

## 2023-12-22 NOTE — Telephone Encounter (Signed)
 Last office visit 11/14/23 for DM.  Last refilled 06/20/2023 for #20 with no refills.  Next Appt: No future appointments with PCP.

## 2023-12-31 ENCOUNTER — Encounter: Payer: Self-pay | Admitting: Podiatry

## 2023-12-31 ENCOUNTER — Ambulatory Visit (INDEPENDENT_AMBULATORY_CARE_PROVIDER_SITE_OTHER): Admitting: Podiatry

## 2023-12-31 ENCOUNTER — Telehealth: Payer: Self-pay

## 2023-12-31 DIAGNOSIS — M79671 Pain in right foot: Secondary | ICD-10-CM

## 2023-12-31 DIAGNOSIS — B351 Tinea unguium: Secondary | ICD-10-CM

## 2023-12-31 DIAGNOSIS — B353 Tinea pedis: Secondary | ICD-10-CM

## 2023-12-31 DIAGNOSIS — M79672 Pain in left foot: Secondary | ICD-10-CM | POA: Diagnosis not present

## 2023-12-31 MED ORDER — TERBINAFINE HCL 250 MG PO TABS: 250.0000 mg | ORAL_TABLET | Freq: Every day | ORAL | 1 refills | Status: AC

## 2023-12-31 NOTE — Telephone Encounter (Signed)
 PA request received for Terbinafine HCl 250mg  tablet. PA submitted through CoverMyMeds and waiting on response.  RANDINE FONDER  (KeyBETHA COPIER) Rx #: [id number]

## 2023-12-31 NOTE — Addendum Note (Signed)
 Addended by: GIB MABLE SAUNDERS on: 12/31/2023 09:37 AM   Modules accepted: Orders

## 2023-12-31 NOTE — Progress Notes (Signed)
 Today follow-up avulsion nails 1 and 2 left after trauma.  Healed well with no complaints.  He has not noticed any drainage the past few days.  Does complain of nails bilaterally hurting with wearing shoes and walking.  Says the nails are getting thicker and more painful over the past several years.  Also complains of a rash on the feet bilaterally.   Physical exam:  General appearance: Pleasant, and in no acute distress. AOx3.  Vascular: Pedal pulses: DP 2/4 bilaterally, PT 2/4 bilaterally. Moderate edema lower legs bilaterally. Capillary fill time immediate..  Neurological: Grossly intact bilaterally  Dermatologic:   Avulsion sites nails 1 and 2 left are healed with no signs of infection.  Onychomycosis 1 through 5 bilaterally with thickening of the nails dystrophic changes subungual debris and tenderness around the nail folds and with pressure on the nail.  Dry scaly erythematous rash moccasin distribution feet bilaterally  Musculoskeletal: Hammertoes 2 through 5 bilaterally    Diagnosis: 1.  Onychomycosis 1 through 5 bilaterally 2.  Pain feet bilaterally. 3.  Pedis bilaterally  Plan: -Established office visit for evaluation and management level 3. - I discussed with him onychomycosis and tinea pedis and etiology and treatment.  Discussed oral versus topical treatments and risks and benefits.  Discussed possible liver toxicity with the Lamisil.  Pulmonary do labs every 6 weeks while under treatment check liver function.  Patient would like to do Lamisil treatment p.o. -Ordered LFTs. - Rx Lamisil 2 and 50 mg 1 p.o. daily, 1 refill  Return 6 weeks Lamisil 3

## 2024-01-01 ENCOUNTER — Telehealth: Payer: Self-pay | Admitting: Lab

## 2024-01-01 LAB — HEPATIC FUNCTION PANEL
ALT: 28 IU/L (ref 0–44)
AST: 21 IU/L (ref 0–40)
Albumin: 4.2 g/dL (ref 3.8–4.9)
Alkaline Phosphatase: 111 IU/L (ref 47–123)
Bilirubin Total: 0.3 mg/dL (ref 0.0–1.2)
Bilirubin, Direct: 0.13 mg/dL (ref 0.00–0.40)
Total Protein: 7.3 g/dL (ref 6.0–8.5)

## 2024-01-01 NOTE — Telephone Encounter (Signed)
 Patient refill denied states had blood work yesterday can you review and refill contact patient of outcome.

## 2024-01-01 NOTE — Telephone Encounter (Signed)
 PA was denied due to the fact no documentation of positive fungal culture.

## 2024-01-06 ENCOUNTER — Ambulatory Visit (INDEPENDENT_AMBULATORY_CARE_PROVIDER_SITE_OTHER): Admitting: Podiatry

## 2024-01-06 DIAGNOSIS — B351 Tinea unguium: Secondary | ICD-10-CM | POA: Diagnosis not present

## 2024-01-06 DIAGNOSIS — M79671 Pain in right foot: Secondary | ICD-10-CM

## 2024-01-06 DIAGNOSIS — M79672 Pain in left foot: Secondary | ICD-10-CM

## 2024-01-06 NOTE — Progress Notes (Signed)
 Patient presents today to get nail samples.  We prescribed him Lamisil but insurance required positive fungal testing before they would approve the Lamisil.   Physical exam:  General appearance: Pleasant, and in no acute distress. AOx3.  Vascular: Pedal pulses: DP 2/4 bilaterally, PT 2/4 bilaterally.  Moderate edema lower legs bilaterally. Capillary fill time immediate bilaterally.  Neurological: Grossly intact bilaterally  Dermatologic:   Thick dystrophic onychomycotic nails with discoloration and yellowing subungual debris and thickening of the nail bilateral 1 through 5 bilaterally.  This is the only new distal thing makes me nuts because like his had a prescription the med Lamisil cost less than the cost of doing the tests Who comes that had on this is lap they make money off I do not lose money I do not understand and I can think about it too much we will make me insane you take care skin normal temperature bilaterally.  Skin normal color, tone, and texture bilaterally.   Musculoskeletal: Hammertoes 2 through 5 bilaterally    Diagnosis: 1.  Onychomycosis with pain 1 through 5 bilaterally 2.  Pain feet bilaterally  Plan: -Established office visit for evaluation and management level 2. - I discussed with the patient that we need to get nail samples to send for fungal testing before we can get the Lamisil approved.   -Nail samples taken today and sent for pathological evaluation.   Return 6 weeks follow-up and if positive start Lamisil

## 2024-01-12 ENCOUNTER — Other Ambulatory Visit: Payer: Self-pay | Admitting: Podiatry

## 2024-01-13 ENCOUNTER — Ambulatory Visit: Payer: Self-pay | Admitting: Podiatry

## 2024-02-11 ENCOUNTER — Ambulatory Visit: Admitting: Podiatry

## 2024-02-11 ENCOUNTER — Encounter: Payer: Self-pay | Admitting: Podiatry

## 2024-02-11 DIAGNOSIS — M79671 Pain in right foot: Secondary | ICD-10-CM

## 2024-02-11 DIAGNOSIS — M79672 Pain in left foot: Secondary | ICD-10-CM

## 2024-02-11 DIAGNOSIS — B351 Tinea unguium: Secondary | ICD-10-CM

## 2024-02-11 MED ORDER — TERBINAFINE HCL 250 MG PO TABS: 250.0000 mg | ORAL_TABLET | Freq: Every day | ORAL | 1 refills | Status: DC

## 2024-02-11 NOTE — Progress Notes (Signed)
   Subjective:    HPI Presents with complaint of thick painful nails that causes discomfort with  walking and wearing shoes.  Have been this way for many years and have been getting worse.  Was unable to get prescription filled until he got the results of the fungal culture back because of his [employer].  Objective:  Physical Exam   General: AAO x3, NAD  Vascular: DP and PT pulses palpable bilaterally.  Immedate capillary fill time digits. No significant lower extremity edema bilaterally.  Dermatological:  Onychomycotic mycotic changes nails 1 through 5 with discoloration nail and subungual debris and thickening of the nail with redness along the nail folds.  Tenderness with pressure on nail plates..  Neruologic:  Grossly intact B/L  Musculoskeletal:  Normal lower extremity muscle strength.  Assessment:  Painful onychomycotic nails 1 through 5 bilaterally. Pain feet b/l     Plan:  - Established office visit level 3 for evaluation and management -Discussed with with patient onychomycosis and etiology and treatment.  Discussed topical versus oral agents.  Discussed risk and benefits of both.  No history of hepatic or renal disease.  Patient would like to start oral Lamisil treatment.  Explained that we would check labs every 6 weeks during course of treatment to monitor for any side effects. -Rx: Lamisil 250 mg p.o. daily, refill x 1 -Fungal cultures and stains were positive for fungal elements.  Return 6 weeks Lamisil 3

## 2024-02-12 ENCOUNTER — Other Ambulatory Visit: Payer: Self-pay | Admitting: Family Medicine

## 2024-02-12 ENCOUNTER — Telehealth: Payer: Self-pay

## 2024-02-12 NOTE — Telephone Encounter (Signed)
 PA request received from CoverMyMeds for Terbinafine HCl 250 mg tablet. PA submitted and waiting for response.  RANDINE FONDER  (Key: [id number]) Rx #: [id number]

## 2024-02-15 ENCOUNTER — Other Ambulatory Visit: Payer: Self-pay | Admitting: Family Medicine

## 2024-02-16 NOTE — Telephone Encounter (Signed)
 lvm for pt to call office to schedule appt.

## 2024-02-16 NOTE — Telephone Encounter (Signed)
 PA was approved.

## 2024-02-16 NOTE — Telephone Encounter (Signed)
 Please call and schedule Diabetes follow up with Dr. Avelina.  He was suppose to follow up around 02/14/24.

## 2024-02-17 NOTE — Telephone Encounter (Signed)
 Lvm to call office

## 2024-03-02 ENCOUNTER — Ambulatory Visit: Admitting: Family Medicine

## 2024-03-02 ENCOUNTER — Encounter: Payer: Self-pay | Admitting: Family Medicine

## 2024-03-02 ENCOUNTER — Other Ambulatory Visit: Payer: Self-pay

## 2024-03-02 ENCOUNTER — Telehealth: Payer: Self-pay | Admitting: Podiatry

## 2024-03-02 VITALS — BP 120/70 | HR 82 | Temp 97.5°F | Ht 73.5 in | Wt 305.0 lb

## 2024-03-02 DIAGNOSIS — B351 Tinea unguium: Secondary | ICD-10-CM

## 2024-03-02 DIAGNOSIS — E1159 Type 2 diabetes mellitus with other circulatory complications: Secondary | ICD-10-CM

## 2024-03-02 DIAGNOSIS — Z7985 Long-term (current) use of injectable non-insulin antidiabetic drugs: Secondary | ICD-10-CM

## 2024-03-02 DIAGNOSIS — I152 Hypertension secondary to endocrine disorders: Secondary | ICD-10-CM

## 2024-03-02 LAB — POCT GLYCOSYLATED HEMOGLOBIN (HGB A1C): Hemoglobin A1C: 7.5 % — AB (ref 4.0–5.6)

## 2024-03-02 LAB — HM DIABETES FOOT EXAM

## 2024-03-02 LAB — OPHTHALMOLOGY REPORT-SCANNED

## 2024-03-02 MED ORDER — TERBINAFINE HCL 250 MG PO TABS: 250.0000 mg | ORAL_TABLET | Freq: Every day | ORAL | 0 refills | Status: AC

## 2024-03-02 MED ORDER — TIRZEPATIDE 2.5 MG/0.5ML ~~LOC~~ SOAJ: 2.5000 mg | SUBCUTANEOUS | 11 refills | Status: AC

## 2024-03-02 NOTE — Telephone Encounter (Signed)
 Patient called for refill, Terbinafine  (Lamisil ) 250 MG tablet, Also pharmacy did not receive authorization from provider for medication

## 2024-03-02 NOTE — Progress Notes (Addendum)
 Patient ID: Jerry Hart, male    DOB: 1971-03-18, 53 y.o.   MRN: [id number]  This visit was conducted in person.  BP 120/70   Pulse 82   Temp (!) 97.5 F (36.4 C) (Temporal)   Ht 6' 1.5 (1.867 m)   Wt (!) 305 lb (138.3 kg)   SpO2 98%   BMI 39.69 kg/m    CC:  Chief Complaint  Patient presents with   Diabetes    Subjective:   HPI: Jerry Hart is a 53 y.o. male presenting on 03/02/2024 for Diabetes  Diabetes: At last OV changed Ozempic  to Mounjaro  5 mg weekly.. he states he had a really decreased appetite.SABRA only ate 3 times in a week.  Not  currently taking anything.  Could not tolerate more than Ozempic  1 mg every 14 days ( not on weekly given ? SBO issues), on Synjardy  XR 25/2000 mg daily,  ON max glimepiride  to 8 mg daily  There has been some improvement in A1c down from 9 to 8. He had lost weight with ozempic .. 30 lbs in last year. .. Has  lost 6 lbs in last 3 months... now some weight gain back   Lab Results  Component Value Date   HGBA1C 7.5 (A) 03/02/2024  Using medications without difficulties: Hypoglycemic episodes: Hyperglycemic episodes: Feet problems: none Blood Sugars averaging: not checking eye exam within last year: due  Wt Readings from Last 3 Encounters:  03/02/24 (!) 305 lb (138.3 kg)  11/14/23 (!) 301 lb 12.8 oz (136.9 kg)  06/20/23 (!) 302 lb 8 oz (137.2 kg)    Hypertension:  Well-controlled on amlodipine  5 mg p.o. daily, benazepril  40 mg daily BP Readings from Last 3 Encounters:  03/02/24 120/70  11/19/23 125/76  11/14/23 112/72  Using medication without problems or lightheadedness:  none Chest pain with exertion: none Edema: none Short of breath: none Average home BPs: Other issues:    Relevant past medical, surgical, family and social history reviewed and updated as indicated. Interim medical history since our last visit reviewed. Allergies and medications reviewed and updated. Outpatient Medications Prior to Visit   Medication Sig Dispense Refill   amLODipine -benazepril  (LOTREL) 5-40 MG capsule Take 1 capsule by mouth daily. 90 capsule 3   benzonatate  (TESSALON ) 200 MG capsule TAKE 1 CAPSULE BY MOUTH 2 TIMES DAILY AS NEEDED FOR COUGH. 20 capsule 0   Continuous Glucose Receiver (DEXCOM G7 RECEIVER) DEVI Check blood sugar as needed 1 each 11   Continuous Glucose Sensor (DEXCOM G7 SENSOR) MISC Check blood sugar as needed 1 each 11   Empagliflozin-metFORMIN  HCl ER (SYNJARDY  XR) 25-1000 MG TB24 TAKE 1 TABLET BY MOUTH EVERY DAY 90 tablet 0   glimepiride  (AMARYL ) 4 MG tablet TAKE 2 TABLETS BY MOUTH DAILY WITH BREAKFAST. 180 tablet 1   HYDROcodone -acetaminophen  (NORCO/VICODIN) 5-325 MG tablet Take 1-2 tablets by mouth every 6 (six) hours as needed. 15 tablet 0   rosuvastatin  (CRESTOR ) 20 MG tablet Take 1 tablet (20 mg total) by mouth every other day.     tadalafil (CIALIS) 5 MG tablet Take 5 mg by mouth daily.     terbinafine  (LAMISIL ) 250 MG tablet Take 1 tablet (250 mg total) by mouth daily. 30 tablet 1   tiZANidine (ZANAFLEX) 2 MG tablet Take 2 mg by mouth at bedtime as needed.     tirzepatide  (MOUNJARO ) 5 MG/0.5ML Pen Inject 5 mg into the skin once a week. 6 mL 3   amoxicillin -clavulanate (AUGMENTIN ) 500-125 MG  tablet Take 1 tablet by mouth every 8 (eight) hours. 21 tablet 0   No facility-administered medications prior to visit.     Per HPI unless specifically indicated in ROS section below Review of Systems  Constitutional:  Negative for fatigue and fever.  HENT:  Negative for ear pain.   Eyes:  Negative for pain.  Respiratory:  Negative for cough and shortness of breath.   Cardiovascular:  Negative for chest pain, palpitations and leg swelling.  Gastrointestinal:  Negative for abdominal pain.  Genitourinary:  Negative for dysuria.  Musculoskeletal:  Negative for arthralgias.  Neurological:  Negative for syncope, light-headedness and headaches.  Psychiatric/Behavioral:  Negative for dysphoric mood.     Objective:  BP 120/70   Pulse 82   Temp (!) 97.5 F (36.4 C) (Temporal)   Ht 6' 1.5 (1.867 m)   Wt (!) 305 lb (138.3 kg)   SpO2 98%   BMI 39.69 kg/m   Wt Readings from Last 3 Encounters:  03/02/24 (!) 305 lb (138.3 kg)  11/14/23 (!) 301 lb 12.8 oz (136.9 kg)  06/20/23 (!) 302 lb 8 oz (137.2 kg)      Physical Exam Constitutional:      Appearance: He is well-developed. He is obese.  HENT:     Head: Normocephalic.     Right Ear: Hearing normal.     Left Ear: Hearing normal.     Nose: Nose normal.  Neck:     Thyroid: No thyroid mass or thyromegaly.     Vascular: No carotid bruit.     Trachea: Trachea normal.  Cardiovascular:     Rate and Rhythm: Normal rate and regular rhythm.     Pulses: Normal pulses.     Heart sounds: Heart sounds not distant. No murmur heard.    No friction rub. No gallop.     Comments: No peripheral edema Pulmonary:     Effort: Pulmonary effort is normal. No respiratory distress.     Breath sounds: Normal breath sounds.  Skin:    General: Skin is warm and dry.     Findings: No rash.  Psychiatric:        Speech: Speech normal.        Behavior: Behavior normal.        Thought Content: Thought content normal.   Diabetic foot exam: Abnormal inspection.. fungal infection.. currently treated by podiatry. No skin breakdown No calluses  Normal DP pulses Normal sensation to light touch and monofilament Nails thickened     Results for orders placed or performed in visit on 03/02/24  [facility] DIABETES FOOT EXAM   Collection Time: 03/02/24 12:00 AM  Result Value Ref Range   [facility] Diabetic Foot Exam done   POCT glycosylated hemoglobin (Hb A1C)   Collection Time: 03/02/24  8:31 AM  Result Value Ref Range   Hemoglobin A1C 7.5 (A) 4.0 - 5.6 %   HbA1c POC (<> result, manual entry)     HbA1c, POC (prediabetic range)     HbA1c, POC (controlled diabetic range)      Assessment and Plan  Type 2 diabetes mellitus with other circulatory complication,  without long-term current use of insulin  (HCC) Assessment & Plan:  Chronic, inadequate control but improving.  Mounjaro  5 mg caused aggressive decreased appetite and decrease BMs... made heim concerned about past SBO.... will try 2.5 mg weekly... but if constipation will stop and continue to focus on lifestyle changes... start exercise, decrease pasta.  On glimepiride  to 8 mg daily.  He is on max Synjardy  XR... Which contains an SGLT2 inhibitor Encouraged him to follow his blood sugar with either the continuous blood sugar monitor or fingersticks so that he can pay more attention to his eating habits and activity.  Reevaluate in 3 months.  Orders: -     POCT glycosylated hemoglobin (Hb A1C)  Hypertension associated with diabetes (HCC) Assessment & Plan: Stable, chronic.  Continue current medication.   Well-controlled on amlodipine  5 mg p.o. daily, benazepril  40 mg daily   Other orders -     Tirzepatide ; Inject 2.5 mg into the skin once a week.  Dispense: 2 mL; Refill: 11       Return in about 3 months (around 05/31/2024) for diabetes follow up with fasting labs prior.   Greig Ring, MD

## 2024-03-02 NOTE — Assessment & Plan Note (Signed)
Stable, chronic.  Continue current medication.   Well-controlled on amlodipine 5 mg p.o. daily, benazepril 40 mg daily

## 2024-03-02 NOTE — Assessment & Plan Note (Addendum)
 Chronic, inadequate control but improving.  Mounjaro  5 mg caused aggressive decreased appetite and decrease BMs... made heim concerned about past SBO.... will try 2.5 mg weekly... but if constipation will stop and continue to focus on lifestyle changes... start exercise, decrease pasta.  On glimepiride  to 8 mg daily.  He is on max Synjardy  XR... Which contains an SGLT2 inhibitor Encouraged him to follow his blood sugar with either the continuous blood sugar monitor or fingersticks so that he can pay more attention to his eating habits and activity.  Reevaluate in 3 months.

## 2024-03-26 ENCOUNTER — Telehealth: Payer: Self-pay

## 2024-03-26 NOTE — Telephone Encounter (Signed)
 Copied from CRM #[id number]. Topic: Clinical - Medical Advice >> Mar 26, 2024 12:59 PM Chasity T wrote: Reason for CRM: pt is calling in to see if pcp can prescribe any medication for the flu due to household currently has it and he wants to prevent from getting it. Please contact back to discuss.

## 2024-03-26 NOTE — Telephone Encounter (Signed)
 Left message on VM per DPR that Dr Avelina and Dr Watt are out of the office today and we have a small skeleton crew of providers in the office today. I was unsure if Dr Avelina would be in her inbasket today. Advised that if he became symptomatic, she should go somewhere and be seen so he can get medication. With so many flu cases right now, I have heard several pharmacies are out of Tamiflu.

## 2024-03-30 MED ORDER — SYNJARDY XR 25-1000 MG PO TB24: 1.0000 | ORAL_TABLET | Freq: Every day | ORAL | 1 refills | Status: AC

## 2024-03-30 NOTE — Addendum Note (Signed)
 Addended by: WENDELL ARLAND RAMAN on: 03/30/2024 02:47 PM   Modules accepted: Orders

## 2024-03-30 NOTE — Telephone Encounter (Signed)
 Spoke with Jerry Hart.  He states he is starting to feel better and doesn't think he needs the Tamiflu now.

## 2024-04-07 ENCOUNTER — Ambulatory Visit: Admitting: Podiatry
